# Patient Record
Sex: Female | Born: 1975 | Race: White | Hispanic: No | Marital: Married | Smoking: Former smoker
Health system: Southern US, Community
[De-identification: ages and names within clinical notes are randomized; demographics above are authoritative.]

## PROBLEM LIST (undated history)

## (undated) DIAGNOSIS — K219 Gastro-esophageal reflux disease without esophagitis: Secondary | ICD-10-CM

## (undated) DIAGNOSIS — F419 Anxiety disorder, unspecified: Secondary | ICD-10-CM

## (undated) DIAGNOSIS — J45909 Unspecified asthma, uncomplicated: Secondary | ICD-10-CM

## (undated) DIAGNOSIS — Z9889 Other specified postprocedural states: Secondary | ICD-10-CM

## (undated) DIAGNOSIS — N939 Abnormal uterine and vaginal bleeding, unspecified: Secondary | ICD-10-CM

## (undated) DIAGNOSIS — D219 Benign neoplasm of connective and other soft tissue, unspecified: Secondary | ICD-10-CM

## (undated) DIAGNOSIS — D649 Anemia, unspecified: Secondary | ICD-10-CM

## (undated) DIAGNOSIS — R519 Headache, unspecified: Secondary | ICD-10-CM

## (undated) DIAGNOSIS — R112 Nausea with vomiting, unspecified: Secondary | ICD-10-CM

## (undated) DIAGNOSIS — R51 Headache: Secondary | ICD-10-CM

## (undated) HISTORY — DX: Anxiety disorder, unspecified: F41.9

## (undated) HISTORY — DX: Abnormal uterine and vaginal bleeding, unspecified: N93.9

## (undated) HISTORY — DX: Benign neoplasm of connective and other soft tissue, unspecified: D21.9

## (undated) HISTORY — DX: Anemia, unspecified: D64.9

---

## 2012-04-24 ENCOUNTER — Ambulatory Visit: Payer: Self-pay | Admitting: Family Medicine

## 2013-12-16 ENCOUNTER — Ambulatory Visit: Payer: Self-pay | Admitting: Physician Assistant

## 2016-09-09 ENCOUNTER — Ambulatory Visit (INDEPENDENT_AMBULATORY_CARE_PROVIDER_SITE_OTHER): Payer: Managed Care, Other (non HMO) | Admitting: Obstetrics & Gynecology

## 2016-09-09 ENCOUNTER — Encounter: Payer: Self-pay | Admitting: Obstetrics & Gynecology

## 2016-09-09 VITALS — BP 102/72 | HR 70 | Resp 16 | Ht 65.5 in | Wt 142.0 lb

## 2016-09-09 DIAGNOSIS — A0472 Enterocolitis due to Clostridium difficile, not specified as recurrent: Secondary | ICD-10-CM | POA: Diagnosis not present

## 2016-09-09 DIAGNOSIS — B001 Herpesviral vesicular dermatitis: Secondary | ICD-10-CM | POA: Insufficient documentation

## 2016-09-09 DIAGNOSIS — J4520 Mild intermittent asthma, uncomplicated: Secondary | ICD-10-CM | POA: Diagnosis not present

## 2016-09-09 DIAGNOSIS — N920 Excessive and frequent menstruation with regular cycle: Secondary | ICD-10-CM

## 2016-09-09 DIAGNOSIS — F411 Generalized anxiety disorder: Secondary | ICD-10-CM | POA: Diagnosis not present

## 2016-09-09 DIAGNOSIS — J45909 Unspecified asthma, uncomplicated: Secondary | ICD-10-CM | POA: Insufficient documentation

## 2016-09-09 LAB — POCT URINE PREGNANCY: PREG TEST UR: NEGATIVE

## 2016-09-09 NOTE — Progress Notes (Addendum)
GYNECOLOGY  VISIT   HPI: 40 y.o. G43P2002 Married Caucasian female here for new patient visit.  She reports she has really heavy cycles that have gradually gotten worse over the past several years.  She reports having two days each month where she can't leave her house.  On these days, she has to change tampons every 30 minutes.  She can pass clots, most recently she passed one the size of her fist.  She's not had any treatment for this except hormonal therapy.  Has been on OCPs and nuva ring.  Has bloating and hormonal side effects  Has been getting gyn at [facility].  Had exam in Jan/Feb.  Reports had normal pap then.  Lives in [place].    GYNECOLOGIC HISTORY: Patient's last menstrual period was 09/05/2016 (exact date). Contraception: None, rarely SA Menopausal hormone therapy: None   Past Medical History:  Diagnosis Date  . Abnormal uterine bleeding   . Anemia   . Anxiety   . Fibroid     Past Surgical History:  Procedure Laterality Date  . CESAREAN SECTION      MEDS:  Reviewed in EPIC and UTD  ALLERGIES: Review of patient's allergies indicates no known allergies.  Family History  Problem Relation Age of Onset  . Melanoma Mother   . Hypertension Mother   . Hyperlipidemia Mother   . Breast cancer Mother   . Heart attack Father   . Neuropathy Father   . Hyperlipidemia Father   . Hypertension Father   . Breast cancer Maternal Grandmother     SH:  Non smoker, married  Review of Systems  All other systems reviewed and are negative.   PHYSICAL EXAMINATION:    BP 102/72 (BP Location: Right Arm, Patient Position: Sitting, Cuff Size: Normal)   Pulse 70   Resp 16   Ht 5' 5.5" (1.664 m)   Wt 142 lb (64.4 kg)   LMP 09/05/2016 (Exact Date)   BMI 23.27 kg/m     General appearance: alert, cooperative and appears stated age No exam performed today Chaperone was present for exam.  Assessment: Menorrhagia H/O prior cesarean section x 2 Mild asthma H/o  anemia  Plan: Plan SHGM with endometrial biopsy.  Pt interested in endometrial ablation.  Mirena IUD use and hysterectomy reviewed as well.  Pt aware needs permanent contraception if proceeds with ablation.  States husband is considering vasectomy.  However, she will consider as well.  As planning PUS, will do pelvic exam then with biopsy as well for surgical planning.    Release of records from PCP will be obtained as well   ~30 minutes spent with patient >50% of time was in face to face discussion of above.  09/19/16.  Outside records obtained.  Pap neg, HR HPV neg, GC/Chl/trich neg.  Obtained 12/18/15.

## 2016-09-17 ENCOUNTER — Other Ambulatory Visit: Payer: Self-pay

## 2016-09-17 DIAGNOSIS — N920 Excessive and frequent menstruation with regular cycle: Secondary | ICD-10-CM

## 2016-09-18 ENCOUNTER — Other Ambulatory Visit: Payer: Managed Care, Other (non HMO)

## 2016-09-18 ENCOUNTER — Telehealth: Payer: Self-pay | Admitting: Obstetrics & Gynecology

## 2016-09-18 ENCOUNTER — Other Ambulatory Visit: Payer: Managed Care, Other (non HMO) | Admitting: Obstetrics & Gynecology

## 2016-09-18 NOTE — Telephone Encounter (Signed)
Patient called and canceled her [facility] with [facility] today. Patient said she is sick and will not be able to come in for her appointment today. Patient is aware she will receive a call to reschedule. 3

## 2016-09-19 ENCOUNTER — Encounter: Payer: Self-pay | Admitting: Obstetrics & Gynecology

## 2016-09-19 NOTE — Telephone Encounter (Signed)
Forwarding to [place] to send letter.  Thanks.

## 2016-09-22 NOTE — Telephone Encounter (Signed)
Patient called to rescheduled appointment for sonohysterogram and endometrial  biopsy. Patient is scheduled on 09/25/16 with Dr Sabra Heck. Patient is aware date, time and cancellation policy. No further questions.  Routing to Dr Sabra Heck

## 2016-09-25 ENCOUNTER — Other Ambulatory Visit: Payer: Managed Care, Other (non HMO)

## 2016-09-25 ENCOUNTER — Other Ambulatory Visit: Payer: Managed Care, Other (non HMO) | Admitting: Obstetrics & Gynecology

## 2016-10-16 ENCOUNTER — Ambulatory Visit (INDEPENDENT_AMBULATORY_CARE_PROVIDER_SITE_OTHER): Payer: Managed Care, Other (non HMO)

## 2016-10-16 ENCOUNTER — Other Ambulatory Visit: Payer: Self-pay | Admitting: Obstetrics & Gynecology

## 2016-10-16 ENCOUNTER — Encounter: Payer: Self-pay | Admitting: Obstetrics & Gynecology

## 2016-10-16 ENCOUNTER — Ambulatory Visit (INDEPENDENT_AMBULATORY_CARE_PROVIDER_SITE_OTHER): Payer: Managed Care, Other (non HMO) | Admitting: Obstetrics & Gynecology

## 2016-10-16 VITALS — BP 116/68 | HR 62 | Resp 14 | Ht 66.0 in | Wt 143.0 lb

## 2016-10-16 DIAGNOSIS — L905 Scar conditions and fibrosis of skin: Secondary | ICD-10-CM | POA: Diagnosis not present

## 2016-10-16 DIAGNOSIS — N920 Excessive and frequent menstruation with regular cycle: Secondary | ICD-10-CM

## 2016-10-16 NOTE — Progress Notes (Signed)
40 y.o. Jamie Lyons here for a pelvic ultrasound with sonohystogram due to menorrhagia and desires for treatment options that do not involve being on HRT.  Pt's cycles have worsened over the past several years.  She has two days where her bleeding is so heavy that she cannot leave her home.  She has been on OCPs and Nuva ring and discontinued these due to bloating and additional hormonal symptoms.  She is most interested in having an endometrial ablation.  Patient's last menstrual period was 10/01/2016.  Contraception:  None, rarely SA  Technique:  Both transabdominal and transvaginal ultrasound examinations of the pelvis were performed. Transabdominal technique was performed for global imaging of the pelvis including uterus, ovaries, adnexal regions, and pelvic cul-de-sac.  It was necessary to proceed with endovaginal exam following the abdominal ultrasound transabdominal exam to visualize the endometrium and adnexa.  Color and duplex Doppler ultrasound was utilized to evaluate blood flow to the ovaries.   FINDINGS: Uterus: 10.4 x 5.8 x 4.2cm Endometrium: 10.25mm Adnexa:  Left: 1.9 x 2.0 x 1.9cm     Right: 3.2 x 2.0 x [id number] with 0000000 follicle Cul de sac: no free fluid  SHSG:  After obtaining appropriate verbal consent from patient, the cervix was visualized using a speculum, and prepped with betadine.  A tenaculum  was applied to the cervix.  Dilation of the cervix was not necessary. The catheter was passed into the uterus and sterile saline introduced, with the following findings:  C section scar 1.10mm from uterine wall.  Findings reviewed with pt.  I do not feel proceeding with endometrial ablation is a good option due to significant risks for bladder injury with ablation.  Options of Mirena IUD vs hysterectomy discussed with pt.  She is concerned about being on hormonal therapy is has to have a hysterectomy.  We reviewed types of hysterectomy and hope for ovarian preservation in a woman  her age.  Increased risks of CVD and bone weakness if ovaries have to be removed.  Recovery and risks generally discussed as well as hospital stay.  If pt decides to proceed with hysterectomy vs IUD, would perform endometrial biopsy as well.  Pt would like to consider options.  Information given.    Assessment:  Menorrhagia that is significantly interfering with her quality of life Very thin uterine wall in area of prior cesarean section.  Do not feel endometrial ablation is safe option considering this finding.   Plan:   Hysterectomy vs Mirena IUD use reviewed.  Pt is going to consider options and make plans in early 2018.  Right now she is leaning towards hysterectomy.  ~30 minutes spent with patient >50% of time was in face to face discussion of above.

## 2016-10-17 ENCOUNTER — Encounter: Payer: Self-pay | Admitting: Obstetrics & Gynecology

## 2016-10-23 DIAGNOSIS — L905 Scar conditions and fibrosis of skin: Secondary | ICD-10-CM | POA: Insufficient documentation

## 2016-10-23 DIAGNOSIS — N920 Excessive and frequent menstruation with regular cycle: Secondary | ICD-10-CM | POA: Insufficient documentation

## 2016-10-27 ENCOUNTER — Telehealth: Payer: Self-pay | Admitting: Obstetrics & Gynecology

## 2016-10-27 NOTE — Telephone Encounter (Signed)
Spoke with patient in regards to surgical benefits. Patient is requesting to schedule in January 2018. Patient understands her benefit plan year will reset on 12/01/16 and we will re-verify at that time. Patient to call back on Friday,  10/31/16 to confirm and proceed with scheduling  Routing to Lamont Snowball  cc: Dr Sabra Heck

## 2016-10-27 NOTE — Telephone Encounter (Signed)
Patient called requesting to schedule surgery. °

## 2016-11-10 NOTE — Telephone Encounter (Signed)
Called patient to follow up regarding recommended surgical procedure. Left message on voicemail requesting a return call.

## 2016-11-11 NOTE — Telephone Encounter (Signed)
Patient is returning a call to [place]. Patient said "I will call Suzy on Friday to schedule my surgery". Patient said you do not need to return her call that she will talk to you on Friday 11/14/16.

## 2016-11-11 NOTE — Telephone Encounter (Signed)
Called patient to follow up regarding recommended surgical procedures. Left message on voicemail requesting a return call

## 2016-11-14 NOTE — Telephone Encounter (Signed)
Return call to patient. Left message to call back. 

## 2016-11-14 NOTE — Telephone Encounter (Signed)
Patient returned call

## 2016-11-14 NOTE — Telephone Encounter (Signed)
Return call from patient at 202pm. Desires to proceed with surgery scheduling. Discussed date options and patient chose 12-22-16. Surgery scheduled for 12-22-16 at 0730 at [facility]. Surgery instruction sheet reviewed and printed copy will be mailed to patient. Questions answered and advised to call back as needed.   Routing to provider for final review. Patient agreeable to disposition. Will close encounter.

## 2016-11-15 ENCOUNTER — Other Ambulatory Visit: Payer: Self-pay | Admitting: Obstetrics & Gynecology

## 2016-11-27 ENCOUNTER — Ambulatory Visit: Payer: Managed Care, Other (non HMO) | Admitting: Obstetrics & Gynecology

## 2016-12-08 ENCOUNTER — Other Ambulatory Visit: Payer: Self-pay | Admitting: Obstetrics & Gynecology

## 2016-12-08 ENCOUNTER — Encounter: Payer: Self-pay | Admitting: Obstetrics & Gynecology

## 2016-12-08 ENCOUNTER — Ambulatory Visit (INDEPENDENT_AMBULATORY_CARE_PROVIDER_SITE_OTHER): Payer: Managed Care, Other (non HMO) | Admitting: Obstetrics & Gynecology

## 2016-12-08 VITALS — BP 108/70 | HR 72 | Resp 16 | Ht 66.0 in | Wt 143.2 lb

## 2016-12-08 DIAGNOSIS — N920 Excessive and frequent menstruation with regular cycle: Secondary | ICD-10-CM

## 2016-12-08 DIAGNOSIS — L905 Scar conditions and fibrosis of skin: Secondary | ICD-10-CM | POA: Diagnosis not present

## 2016-12-08 MED ORDER — OXYCODONE-ACETAMINOPHEN 5-325 MG PO TABS: 2.0000 | ORAL_TABLET | ORAL | 0 refills | Status: DC | PRN

## 2016-12-08 MED ORDER — IBUPROFEN 800 MG PO TABS: 800.0000 mg | ORAL_TABLET | Freq: Three times a day (TID) | ORAL | 0 refills | Status: DC | PRN

## 2016-12-08 NOTE — Addendum Note (Signed)
Addended by: [name] on: 12/08/2016 05:06 PM   Modules accepted: Orders

## 2016-12-08 NOTE — Progress Notes (Unsigned)
41 y.o. [id number] Married{Race/ethnicity:[id number]} female here for discussion of upcoming procedure.  *** planned due to ***.  Pre-op evaluation thus far has included ***.     Ob Hx:   Patient's last menstrual period was 11/26/2016.          Sexually active: {yes no:314532} Birth control: {PLAN CONTRACEPTION:313102} Last pap: Last MMG: Tobacco:  Past Surgical History:  Procedure Laterality Date  . CESAREAN SECTION     2003, 2009    Past Medical History:  Diagnosis Date  . Abnormal uterine bleeding   . Anemia   . Anxiety   . Fibroid     Allergies: Patient has no known allergies.  Current Outpatient Prescriptions  Medication Sig Dispense Refill  . albuterol (PROVENTIL HFA;VENTOLIN HFA) 108 (90 Base) MCG/ACT inhaler Inhale 2 puffs into the lungs daily as needed for wheezing or shortness of breath.     . cetirizine (ZYRTEC) 10 MG tablet Take 10 mg by mouth daily as needed for allergies (alternates between zyrtec and allegra).     . clonazePAM (KLONOPIN) 0.5 MG tablet Take 0.5 mg by mouth daily as needed for anxiety.    . fexofenadine (ALLEGRA) 180 MG tablet Take 180 mg by mouth daily. Alternates between zyrtec and allegra    . ibuprofen (ADVIL,MOTRIN) 200 MG tablet Take 600 mg by mouth every 6 (six) hours as needed for mild pain.    Marland Kitchen ibuprofen (ADVIL,MOTRIN) 800 MG tablet Take 1 tablet (800 mg total) by mouth every 8 (eight) hours as needed. 30 tablet 0  . Omega-3 Fatty Acids (FISH OIL) 500 MG CAPS Take 1,000 mg by mouth daily.     Marland Kitchen oxyCODONE-acetaminophen (PERCOCET) 5-325 MG tablet Take 2 tablets by mouth every 4 (four) hours as needed. use only as much as needed to relieve pain 30 tablet 0  . Probiotic Product (PROBIOTIC PO) Take 1 tablet by mouth daily. Probiotic Restore Ultra    . tetrahydrozoline-zinc (VISINE-AC) 0.05-0.25 % ophthalmic solution Place 2 drops into both eyes 3 (three) times daily as needed (dry eyes).    . valACYclovir (VALTREX) 1000 MG tablet Take 2 tablets by mouth  2 (two) times daily as needed (outbreaks).      No current facility-administered medications for this visit.     ROS: {Ros - complete:30496}  Exam:    LMP 11/26/2016   General appearance: alert and cooperative Head: Normocephalic, without obvious abnormality, atraumatic Neck: no adenopathy, supple, symmetrical, trachea midline and thyroid not enlarged, symmetric, no tenderness/mass/nodules Lungs: clear to auscultation bilaterally Heart: regular rate and rhythm, S1, S2 normal, no murmur, click, rub or gallop Abdomen: soft, non-tender; bowel sounds normal; no masses,  no organomegaly Extremities: extremities normal, atraumatic, no cyanosis or edema Skin: Skin color, texture, turgor normal. No rashes or lesions Lymph nodes: Cervical, supraclavicular, and axillary nodes normal. no inguinal nodes palpated Neurologic: Grossly normal  Pelvic: External genitalia:  no lesions              Urethra: normal appearing urethra with no masses, tenderness or lesions              Bartholins and Skenes: {EXAM; GYN RL:[id number]                 Vagina: {vagina:315903::"normal appearing vagina with normal color and discharge, no lesions"}              Cervix: {exam; gyn cervix:30847}              Pap taken: {yes  NH:[id number]        Bimanual Exam:  Uterus:  {uterus:315905::"uterus is normal size, shape, consistency and nontender"}                                      Adnexa:    {adnexa:311645::"not indicated"}                                      Rectovaginal: Deferred                                      Anus:  {Exam; anus:16940}  A: {CCO Gynecologic Problems:21020265}     P:  *** planned Rx for Motrin and Percocet given. Medications/Vitamins reviewed.  Pt knows needs to stop ***. Hysterectomy brochure given for pre and post op instructions.

## 2016-12-08 NOTE — Progress Notes (Signed)
[name] here to discuss plans for treatment of menorrhagia.  Pt was initially interested in endometrial ablation but with ultrasound performed on 12/22/16, her lower uterine segment was noted to be very thin and concern for bladder thermal injury with ablation was discussed.  She has decided to proceed with hysterectomy.  She is here for discussion of this today.  As well, endometrial boipsy is planned as this has not yet been done.    Procedure discussed with patient.  Hospital stay, recovery and pain management all discussed.  Risks discussed including but not limited to bleeding, 1% risk of receiving a  transfusion, infection, 3-4% risk of bowel/bladder/ureteral/vascular injury discussed as well as possible need for additional surgery if injury does occur discussed.  DVT/PE and rare risk of death discussed.  My actual complications with prior surgeries discussed.  Vaginal cuff dehiscence discussed.  Hernia formation discussed.  Positioning and incision locations discussed.  Patient aware if pathology abnormal she may need additional treatment.  All questions answered.   Ob Hx:   Patient's last menstrual period was 11/26/2016.          Sexually active: No. Birth control: abstinence Last pap: 12/18/15 negative, HR HPV negative  Last MMG: Never Tobacco: Former smoker  Past Surgical History:  Procedure Laterality Date  . CESAREAN SECTION     2003, 2009    Past Medical History:  Diagnosis Date  . Abnormal uterine bleeding   . Anemia   . Anxiety   . Fibroid     Allergies: Patient has no known allergies.  Current Outpatient Prescriptions  Medication Sig Dispense Refill  . albuterol (PROVENTIL HFA;VENTOLIN HFA) 108 (90 Base) MCG/ACT inhaler Inhale 2 puffs into the lungs daily as needed for wheezing or shortness of breath.     . cetirizine (ZYRTEC) 10 MG tablet Take 10 mg by mouth daily as needed for allergies (alternates between zyrtec and allegra).     .  clonazePAM (KLONOPIN) 0.5 MG tablet Take 0.5 mg by mouth daily as needed for anxiety.    . fexofenadine (ALLEGRA) 180 MG tablet Take 180 mg by mouth daily. Alternates between zyrtec and allegra    . ibuprofen (ADVIL,MOTRIN) 200 MG tablet Take 600 mg by mouth every 6 (six) hours as needed for mild pain.    . Omega-3 Fatty Acids (FISH OIL) 500 MG CAPS Take 1,000 mg by mouth daily.     . Probiotic Product (PROBIOTIC PO) Take 1 tablet by mouth daily. Probiotic Restore Ultra    . tetrahydrozoline-zinc (VISINE-AC) 0.05-0.25 % ophthalmic solution Place 2 drops into both eyes 3 (three) times daily as needed (dry eyes).    . valACYclovir (VALTREX) 1000 MG tablet Take 2 tablets by mouth 2 (two) times daily as needed (outbreaks).      No current facility-administered medications for this visit.     ROS: Pertinent items noted in HPI and remainder of comprehensive ROS otherwise negative.  Exam:    BP 108/70 (BP Location: Right Arm, Patient Position: Sitting, Cuff Size: Normal)   Pulse 72   Resp 16   Ht 5\' 6"  (1.676 m)   Wt 143 lb 3.2 oz (65 kg)   LMP 11/26/2016   BMI 23.11 kg/m   General appearance: alert and cooperative Head: Normocephalic, without obvious abnormality, atraumatic Neck: no adenopathy, supple, symmetrical, trachea midline and thyroid not enlarged, symmetric, no tenderness/mass/nodules Lungs: clear to auscultation bilaterally Heart: regular rate and rhythm, S1, S2 normal, no murmur, click, rub  or gallop Abdomen: soft, non-tender; bowel sounds normal; no masses,  no organomegaly Extremities: extremities normal, atraumatic, no cyanosis or edema Skin: Skin color, texture, turgor normal. No rashes or lesions Lymph nodes: Cervical, supraclavicular, and axillary nodes normal. no inguinal nodes palpated Neurologic: Grossly normal  Pelvic: External genitalia:  no lesions              Urethra: normal appearing urethra with no masses, tenderness or lesions              Bartholins and  Skenes: normal                 Vagina: normal appearing vagina with normal color and discharge, no lesions              Cervix: normal appearance              Pap taken: No.        Bimanual Exam:  Uterus:  uterus is normal size, shape, consistency and nontender                                      Adnexa:    normal adnexa in size, nontender and no masses                                      Rectovaginal: Deferred                                      Anus:  normal sphincter tone, no lesions  Endometrial biopsy recommended.  Discussed with patient.  Verbal consent obtained.   Procedure:  Speculum placed.  Cervix visualized and cleansed with betadine prep.  A single toothed tenaculum was applied to the anterior lip of the cervix.  Endometrial pipelle was advanced through the cervix into the endometrial cavity without difficulty.  Pipelle passed to 8.5cm.  Suction applied and pipelle removed with good tissue sample obtained.  Two passes performed.  Tenculum removed.  No bleeding noted.  Patient tolerated procedure well.  A: Menorrhagia Thin lower uterine segment with concerns for thermal bladder injury with ablation  P:  TLH/bilateral salpingectomy/cystoscopy planned Rx for Motrin and Percocet given. Medications/Vitamins reviewed. Hysterectomy brochure given for pre and post op instructions.  ~30 minutes spent with patient >50% of time was in face to face discussion of above.

## 2016-12-10 NOTE — Patient Instructions (Addendum)
Your procedure is scheduled on:  Monday, Jan. 22, 2018  Enter through the [employer] [facility] at:  6:00 AM  Pick up the phone at the desk and dial [phone].  Call this number if you have problems the morning of surgery: [phone].  Remember: Do NOT eat food or drink after:  Midnight Sunday, Jan. 21, 2018  Take these medicines the morning of surgery with a SIP OF WATER:  Allegra, Clonazepam if needed  Bring Asthma Inhaler day of surgery  Stop ALL herbal medications, Ibuprofen, and fish oil at this time  Do NOT smoke the day of surgery.  Do NOT wear jewelry (body piercing), metal hair clips/bobby pins, make-up, or nail polish. Do NOT wear lotions, powders, or perfumes.  You may wear deodorant. Do NOT shave for 48 hours prior to surgery. Do NOT bring valuables to the hospital. Contacts, dentures, or bridgework may not be worn into surgery.  Leave suitcase in car.  After surgery it may be brought to your room.  For patients admitted to the hospital, checkout time is 11:00 AM the day of discharge.  Bring a copy of your healthcare power of attorney and living will documents.

## 2016-12-11 ENCOUNTER — Encounter (HOSPITAL_COMMUNITY): Payer: Self-pay

## 2016-12-11 ENCOUNTER — Encounter (HOSPITAL_COMMUNITY)
Admission: RE | Admit: 2016-12-11 | Discharge: 2016-12-11 | Disposition: A | Discharge status: Home / Self Care | Payer: Managed Care, Other (non HMO) | Source: Ambulatory Visit | Attending: Obstetrics & Gynecology | Admitting: Obstetrics & Gynecology

## 2016-12-11 DIAGNOSIS — Z01818 Encounter for other preprocedural examination: Secondary | ICD-10-CM | POA: Diagnosis present

## 2016-12-11 HISTORY — DX: Unspecified asthma, uncomplicated: J45.909

## 2016-12-11 HISTORY — DX: Headache: R51

## 2016-12-11 HISTORY — DX: Gastro-esophageal reflux disease without esophagitis: K21.9

## 2016-12-11 HISTORY — DX: Headache, unspecified: R51.9

## 2016-12-11 LAB — TYPE AND SCREEN
ABO/RH(D): A POS
Antibody Screen: NEGATIVE

## 2016-12-11 LAB — CBC
HCT: 35.1 % — ABNORMAL LOW (ref 36.0–46.0)
Hemoglobin: 12.5 g/dL (ref 12.0–15.0)
MCH: 33.1 pg (ref 26.0–34.0)
MCHC: 35.6 g/dL (ref 30.0–36.0)
MCV: 92.9 fL (ref 78.0–100.0)
PLATELETS: 211 10*3/uL (ref 150–400)
RBC: 3.78 MIL/uL — ABNORMAL LOW (ref 3.87–5.11)
RDW: 12.8 % (ref 11.5–15.5)
WBC: 8.5 10*3/uL (ref 4.0–10.5)

## 2016-12-11 LAB — ABO/RH: ABO/RH(D): A POS

## 2016-12-12 ENCOUNTER — Telehealth: Payer: Self-pay | Admitting: Obstetrics & Gynecology

## 2016-12-12 NOTE — Telephone Encounter (Signed)
Spoke with patient. Advised results from endometrial biopsy have not returned at this time which is not uncommon. Advised this can take around a week to return and she will be contacted as soon as the results have returned. Patient is agreeable.  Routing to provider for final review. Patient agreeable to disposition. Will close encounter.

## 2016-12-12 NOTE — Telephone Encounter (Signed)
Patient is asking if Dr.Miller has received the results from her cervical biopsy done on Monday.

## 2016-12-21 ENCOUNTER — Encounter (HOSPITAL_COMMUNITY): Payer: Self-pay | Admitting: Anesthesiology

## 2016-12-21 MED ORDER — DEXTROSE 5 % IV SOLN
2.0000 g | INTRAVENOUS | Status: AC
  Administered 2016-12-22: 2 g via INTRAVENOUS
  Filled 2016-12-21: qty 2

## 2016-12-21 NOTE — Anesthesia Preprocedure Evaluation (Addendum)
Anesthesia Evaluation  Patient identified by MRN, date of birth, ID band Patient awake    Reviewed: Allergy & Precautions, NPO status , reviewed documented beta blocker date and time   History of Anesthesia Complications (+) PONV and history of anesthetic complications  Airway Mallampati: II  TM Distance: >3 FB Neck ROM: Full    Dental no notable dental hx. (+) Teeth Intact   Pulmonary asthma , former smoker,    Pulmonary exam normal breath sounds clear to auscultation       Cardiovascular Exercise Tolerance: Good negative cardio ROS Normal cardiovascular exam Rhythm:Regular Rate:Normal     Neuro/Psych  Headaches, Anxiety    GI/Hepatic Neg liver ROS, GERD  Medicated and Controlled,  Endo/Other  negative endocrine ROS  Renal/GU negative Renal ROS  negative genitourinary   Musculoskeletal negative musculoskeletal ROS (+)   Abdominal   Peds  Hematology  (+) anemia ,   Anesthesia Other Findings   Reproductive/Obstetrics Menorrhagia Uterine fibroids HSV                             Anesthesia Physical Anesthesia Plan  ASA: II  Anesthesia Plan: General   Post-op Pain Management:    Induction: Intravenous  Airway Management Planned: Oral ETT  Additional Equipment:   Intra-op Plan:   Post-operative Plan: Extubation in OR  Informed Consent:   Plan Discussed with:   Anesthesia Plan Comments:         Anesthesia Quick Evaluation

## 2016-12-22 ENCOUNTER — Ambulatory Visit (HOSPITAL_COMMUNITY)
Admission: RE | Admit: 2016-12-22 | Discharge: 2016-12-23 | Disposition: A | Discharge status: Home / Self Care | Payer: Managed Care, Other (non HMO) | Source: Ambulatory Visit | Attending: Obstetrics & Gynecology | Admitting: Obstetrics & Gynecology

## 2016-12-22 ENCOUNTER — Encounter (HOSPITAL_COMMUNITY)
Admission: RE | Disposition: A | Discharge status: Home / Self Care | Payer: Self-pay | Source: Ambulatory Visit | Attending: Obstetrics & Gynecology

## 2016-12-22 ENCOUNTER — Encounter (HOSPITAL_COMMUNITY): Payer: Self-pay | Admitting: *Deleted

## 2016-12-22 ENCOUNTER — Ambulatory Visit (HOSPITAL_COMMUNITY): Payer: Managed Care, Other (non HMO) | Admitting: Anesthesiology

## 2016-12-22 DIAGNOSIS — F419 Anxiety disorder, unspecified: Secondary | ICD-10-CM | POA: Insufficient documentation

## 2016-12-22 DIAGNOSIS — J45909 Unspecified asthma, uncomplicated: Secondary | ICD-10-CM | POA: Insufficient documentation

## 2016-12-22 DIAGNOSIS — Z79899 Other long term (current) drug therapy: Secondary | ICD-10-CM | POA: Insufficient documentation

## 2016-12-22 DIAGNOSIS — N736 Female pelvic peritoneal adhesions (postinfective): Secondary | ICD-10-CM | POA: Insufficient documentation

## 2016-12-22 DIAGNOSIS — N3289 Other specified disorders of bladder: Secondary | ICD-10-CM | POA: Diagnosis not present

## 2016-12-22 DIAGNOSIS — Z87891 Personal history of nicotine dependence: Secondary | ICD-10-CM | POA: Insufficient documentation

## 2016-12-22 DIAGNOSIS — N888 Other specified noninflammatory disorders of cervix uteri: Secondary | ICD-10-CM | POA: Insufficient documentation

## 2016-12-22 DIAGNOSIS — N920 Excessive and frequent menstruation with regular cycle: Secondary | ICD-10-CM | POA: Diagnosis not present

## 2016-12-22 HISTORY — PX: LAPAROSCOPIC HYSTERECTOMY: SHX1926

## 2016-12-22 HISTORY — DX: Nausea with vomiting, unspecified: Z98.890

## 2016-12-22 HISTORY — DX: Nausea with vomiting, unspecified: R11.2

## 2016-12-22 HISTORY — PX: LAPAROSCOPIC BILATERAL SALPINGECTOMY: SHX5889

## 2016-12-22 HISTORY — PX: CYSTOSCOPY: SHX5120

## 2016-12-22 LAB — PREGNANCY, URINE: Preg Test, Ur: NEGATIVE

## 2016-12-22 SURGERY — HYSTERECTOMY, TOTAL, LAPAROSCOPIC
Anesthesia: General | Site: Bladder

## 2016-12-22 MED ORDER — SCOPOLAMINE 1 MG/3DAYS TD PT72
MEDICATED_PATCH | TRANSDERMAL | Status: AC
  Administered 2016-12-22: 1.5 mg via TRANSDERMAL
  Filled 2016-12-22: qty 1

## 2016-12-22 MED ORDER — BUPIVACAINE HCL (PF) 0.25 % IJ SOLN
INTRAMUSCULAR | Status: AC
  Filled 2016-12-22: qty 60

## 2016-12-22 MED ORDER — FENTANYL CITRATE (PF) 100 MCG/2ML IJ SOLN
INTRAMUSCULAR | Status: DC | PRN
  Administered 2016-12-22: 100 ug via INTRAVENOUS
  Administered 2016-12-22 (×2): 50 ug via INTRAVENOUS
  Administered 2016-12-22 (×3): 100 ug via INTRAVENOUS

## 2016-12-22 MED ORDER — LIDOCAINE HCL (CARDIAC) 20 MG/ML IV SOLN
INTRAVENOUS | Status: AC
  Filled 2016-12-22: qty 5

## 2016-12-22 MED ORDER — DEXAMETHASONE SODIUM PHOSPHATE 10 MG/ML IJ SOLN
INTRAMUSCULAR | Status: AC
  Filled 2016-12-22: qty 1

## 2016-12-22 MED ORDER — SODIUM CHLORIDE 0.9 % IR SOLN
Status: DC | PRN
  Administered 2016-12-22: 3000 mL

## 2016-12-22 MED ORDER — SODIUM CHLORIDE 0.9% FLUSH: 9.0000 mL | INTRAVENOUS | Status: DC | PRN

## 2016-12-22 MED ORDER — METOCLOPRAMIDE HCL 5 MG/ML IJ SOLN
10.0000 mg | Freq: Once | INTRAMUSCULAR | Status: AC | PRN
  Administered 2016-12-22: 10 mg via INTRAVENOUS

## 2016-12-22 MED ORDER — ONDANSETRON HCL 4 MG/2ML IJ SOLN
4.0000 mg | Freq: Four times a day (QID) | INTRAMUSCULAR | Status: DC | PRN
  Administered 2016-12-22: 4 mg via INTRAVENOUS
  Filled 2016-12-22: qty 2

## 2016-12-22 MED ORDER — ONDANSETRON HCL 4 MG/2ML IJ SOLN
INTRAMUSCULAR | Status: DC | PRN
  Administered 2016-12-22: 4 mg via INTRAVENOUS

## 2016-12-22 MED ORDER — ACETAMINOPHEN 10 MG/ML IV SOLN
1000.0000 mg | Freq: Once | INTRAVENOUS | Status: AC
  Administered 2016-12-22: 1000 mg via INTRAVENOUS
  Filled 2016-12-22: qty 100

## 2016-12-22 MED ORDER — HYDROMORPHONE HCL 1 MG/ML IJ SOLN
INTRAMUSCULAR | Status: DC | PRN
  Administered 2016-12-22: 1 mg via INTRAVENOUS

## 2016-12-22 MED ORDER — PROPOFOL 10 MG/ML IV BOLUS
INTRAVENOUS | Status: DC | PRN
  Administered 2016-12-22: 50 mg via INTRAVENOUS
  Administered 2016-12-22: 150 mg via INTRAVENOUS

## 2016-12-22 MED ORDER — KETOROLAC TROMETHAMINE 30 MG/ML IJ SOLN
30.0000 mg | Freq: Four times a day (QID) | INTRAMUSCULAR | Status: DC
  Administered 2016-12-22 – 2016-12-23 (×4): 30 mg via INTRAVENOUS
  Filled 2016-12-22 (×4): qty 1

## 2016-12-22 MED ORDER — OXYCODONE-ACETAMINOPHEN 5-325 MG PO TABS: 1.0000 | ORAL_TABLET | ORAL | Status: DC | PRN

## 2016-12-22 MED ORDER — SIMETHICONE 80 MG PO CHEW
80.0000 mg | CHEWABLE_TABLET | Freq: Four times a day (QID) | ORAL | Status: DC | PRN
Start: 2016-12-22 — End: 2016-12-23
  Administered 2016-12-23: 80 mg via ORAL
  Filled 2016-12-22: qty 1

## 2016-12-22 MED ORDER — HYDROMORPHONE 1 MG/ML IV SOLN
INTRAVENOUS | Status: DC
  Administered 2016-12-22: 2.6 mg via INTRAVENOUS
  Administered 2016-12-22: 14:00:00 via INTRAVENOUS
  Administered 2016-12-22: 3.2 mg via INTRAVENOUS
  Administered 2016-12-23: 0.8 mg via INTRAVENOUS
  Administered 2016-12-23: 0.4 mg via INTRAVENOUS
  Administered 2016-12-23: 3 mg via INTRAVENOUS
  Filled 2016-12-22: qty 25

## 2016-12-22 MED ORDER — LACTATED RINGERS IV SOLN
INTRAVENOUS | Status: DC
  Administered 2016-12-22 (×3): via INTRAVENOUS

## 2016-12-22 MED ORDER — FENTANYL CITRATE (PF) 250 MCG/5ML IJ SOLN
INTRAMUSCULAR | Status: AC
  Filled 2016-12-22: qty 5

## 2016-12-22 MED ORDER — SUGAMMADEX SODIUM 500 MG/5ML IV SOLN
INTRAVENOUS | Status: AC
  Filled 2016-12-22: qty 5

## 2016-12-22 MED ORDER — PANTOPRAZOLE SODIUM 40 MG IV SOLR
40.0000 mg | Freq: Every day | INTRAVENOUS | Status: DC
  Administered 2016-12-22: 40 mg via INTRAVENOUS
  Filled 2016-12-22 (×2): qty 40

## 2016-12-22 MED ORDER — KETOROLAC TROMETHAMINE 30 MG/ML IJ SOLN
INTRAMUSCULAR | Status: AC
  Filled 2016-12-22: qty 1

## 2016-12-22 MED ORDER — DIPHENHYDRAMINE HCL 12.5 MG/5ML PO ELIX: 12.5000 mg | ORAL_SOLUTION | Freq: Four times a day (QID) | ORAL | Status: DC | PRN

## 2016-12-22 MED ORDER — PROPOFOL 10 MG/ML IV BOLUS
INTRAVENOUS | Status: AC
  Filled 2016-12-22: qty 20

## 2016-12-22 MED ORDER — HYDROCORTISONE 2.5 % RE CREA
TOPICAL_CREAM | Freq: Four times a day (QID) | RECTAL | Status: DC
  Administered 2016-12-23: 14:00:00 via RECTAL
  Filled 2016-12-22: qty 28.35

## 2016-12-22 MED ORDER — SUGAMMADEX SODIUM 200 MG/2ML IV SOLN
INTRAVENOUS | Status: DC | PRN
  Administered 2016-12-22: 200 mg via INTRAVENOUS

## 2016-12-22 MED ORDER — LIDOCAINE 5 % EX OINT
TOPICAL_OINTMENT | Freq: Four times a day (QID) | CUTANEOUS | Status: DC | PRN
  Administered 2016-12-23: 14:00:00 via TOPICAL
  Filled 2016-12-22: qty 35.44

## 2016-12-22 MED ORDER — MIDAZOLAM HCL 2 MG/2ML IJ SOLN
INTRAMUSCULAR | Status: AC
  Filled 2016-12-22: qty 2

## 2016-12-22 MED ORDER — BUPIVACAINE HCL (PF) 0.25 % IJ SOLN
INTRAMUSCULAR | Status: DC | PRN
  Administered 2016-12-22: 16 mL

## 2016-12-22 MED ORDER — KETOROLAC TROMETHAMINE 30 MG/ML IJ SOLN: 30.0000 mg | Freq: Four times a day (QID) | INTRAMUSCULAR | Status: DC

## 2016-12-22 MED ORDER — HYDROMORPHONE HCL 1 MG/ML IJ SOLN: 0.2500 mg | INTRAMUSCULAR | Status: DC | PRN

## 2016-12-22 MED ORDER — SUGAMMADEX SODIUM 200 MG/2ML IV SOLN
INTRAVENOUS | Status: AC
  Filled 2016-12-22: qty 2

## 2016-12-22 MED ORDER — DEXTROSE-NACL 5-0.45 % IV SOLN
INTRAVENOUS | Status: DC
  Administered 2016-12-22 – 2016-12-23 (×3): via INTRAVENOUS

## 2016-12-22 MED ORDER — MIDAZOLAM HCL 2 MG/2ML IJ SOLN
INTRAMUSCULAR | Status: DC | PRN
  Administered 2016-12-22: 2 mg via INTRAVENOUS

## 2016-12-22 MED ORDER — KETOROLAC TROMETHAMINE 30 MG/ML IJ SOLN
INTRAMUSCULAR | Status: DC | PRN
  Administered 2016-12-22: 30 mg via INTRAVENOUS

## 2016-12-22 MED ORDER — SCOPOLAMINE 1 MG/3DAYS TD PT72
1.0000 | MEDICATED_PATCH | Freq: Once | TRANSDERMAL | Status: DC
  Administered 2016-12-22: 1.5 mg via TRANSDERMAL

## 2016-12-22 MED ORDER — METOCLOPRAMIDE HCL 5 MG/ML IJ SOLN
INTRAMUSCULAR | Status: AC
  Administered 2016-12-22: 10 mg via INTRAVENOUS
  Filled 2016-12-22: qty 2

## 2016-12-22 MED ORDER — NALOXONE HCL 0.4 MG/ML IJ SOLN: 0.4000 mg | INTRAMUSCULAR | Status: DC | PRN

## 2016-12-22 MED ORDER — ALUM & MAG HYDROXIDE-SIMETH 200-200-20 MG/5ML PO SUSP: 30.0000 mL | ORAL | Status: DC | PRN

## 2016-12-22 MED ORDER — SODIUM CHLORIDE 0.9 % IV SOLN
INTRAVENOUS | Status: DC | PRN
  Administered 2016-12-22: 60 mL

## 2016-12-22 MED ORDER — DIPHENHYDRAMINE HCL 50 MG/ML IJ SOLN: 12.5000 mg | Freq: Four times a day (QID) | INTRAMUSCULAR | Status: DC | PRN

## 2016-12-22 MED ORDER — ACETAMINOPHEN 325 MG PO TABS: 650.0000 mg | ORAL_TABLET | ORAL | Status: DC | PRN

## 2016-12-22 MED ORDER — FENTANYL CITRATE (PF) 100 MCG/2ML IJ SOLN
INTRAMUSCULAR | Status: AC
  Filled 2016-12-22: qty 2

## 2016-12-22 MED ORDER — ROCURONIUM BROMIDE 100 MG/10ML IV SOLN
INTRAVENOUS | Status: AC
  Filled 2016-12-22: qty 1

## 2016-12-22 MED ORDER — HYDROMORPHONE HCL 1 MG/ML IJ SOLN
INTRAMUSCULAR | Status: AC
  Filled 2016-12-22: qty 1

## 2016-12-22 MED ORDER — MENTHOL 3 MG MT LOZG: 1.0000 | LOZENGE | OROMUCOSAL | Status: DC | PRN

## 2016-12-22 MED ORDER — MEPERIDINE HCL 25 MG/ML IJ SOLN: 6.2500 mg | INTRAMUSCULAR | Status: DC | PRN

## 2016-12-22 MED ORDER — ROCURONIUM BROMIDE 100 MG/10ML IV SOLN
INTRAVENOUS | Status: DC | PRN
  Administered 2016-12-22 (×3): 10 mg via INTRAVENOUS
  Administered 2016-12-22: 20 mg via INTRAVENOUS
  Administered 2016-12-22 (×3): 10 mg via INTRAVENOUS
  Administered 2016-12-22: 50 mg via INTRAVENOUS
  Administered 2016-12-22: 10 mg via INTRAVENOUS

## 2016-12-22 MED ORDER — STERILE WATER FOR IRRIGATION IR SOLN
Status: DC | PRN
  Administered 2016-12-22: 1000 mL

## 2016-12-22 MED ORDER — DEXAMETHASONE SODIUM PHOSPHATE 10 MG/ML IJ SOLN
INTRAMUSCULAR | Status: DC | PRN
  Administered 2016-12-22: 10 mg via INTRAVENOUS

## 2016-12-22 MED ORDER — PROMETHAZINE HCL 25 MG/ML IJ SOLN
12.5000 mg | INTRAMUSCULAR | Status: DC | PRN
Start: 2016-12-22 — End: 2016-12-23
  Administered 2016-12-22 – 2016-12-23 (×2): 25 mg via INTRAVENOUS
  Filled 2016-12-22 (×2): qty 1

## 2016-12-22 MED ORDER — ALBUTEROL SULFATE (2.5 MG/3ML) 0.083% IN NEBU: 3.0000 mL | INHALATION_SOLUTION | Freq: Every day | RESPIRATORY_TRACT | Status: DC | PRN

## 2016-12-22 MED ORDER — ROPIVACAINE HCL 5 MG/ML IJ SOLN
INTRAMUSCULAR | Status: AC
  Filled 2016-12-22: qty 30

## 2016-12-22 MED ORDER — ONDANSETRON HCL 4 MG/2ML IJ SOLN
INTRAMUSCULAR | Status: AC
  Filled 2016-12-22: qty 2

## 2016-12-22 SURGICAL SUPPLY — 73 items
APPLICATOR ARISTA FLEXITIP XL (MISCELLANEOUS) ×5 IMPLANT
BAG URINE DRAINAGE (UROLOGICAL SUPPLIES) ×5 IMPLANT
BENZOIN TINCTURE PRP APPL 2/3 (GAUZE/BANDAGES/DRESSINGS) IMPLANT
CABLE HIGH FREQUENCY MONO STRZ (ELECTRODE) ×5 IMPLANT
CATH FOLEY 3WAY 30CC 16FR (CATHETERS) ×5 IMPLANT
CATH ROBINSON RED A/P 16FR (CATHETERS) IMPLANT
CATH SILICONE 16FRX5CC (CATHETERS) IMPLANT
CLOSURE WOUND 1/2 X4 (GAUZE/BANDAGES/DRESSINGS)
CLOTH BEACON ORANGE TIMEOUT ST (SAFETY) ×5 IMPLANT
COVER LIGHT HANDLE  1/PK (MISCELLANEOUS) ×2
COVER LIGHT HANDLE 1/PK (MISCELLANEOUS) ×3 IMPLANT
COVER MAYO STAND STRL (DRAPES) ×5 IMPLANT
DERMABOND ADVANCED (GAUZE/BANDAGES/DRESSINGS) ×2
DERMABOND ADVANCED .7 DNX12 (GAUZE/BANDAGES/DRESSINGS) ×3 IMPLANT
DRSG OPSITE POSTOP 3X4 (GAUZE/BANDAGES/DRESSINGS) ×5 IMPLANT
DURAPREP 26ML APPLICATOR (WOUND CARE) ×5 IMPLANT
GLOVE BIOGEL PI IND STRL 7.0 (GLOVE) ×21 IMPLANT
GLOVE BIOGEL PI INDICATOR 7.0 (GLOVE) ×14
GLOVE ECLIPSE 6.5 STRL STRAW (GLOVE) ×25 IMPLANT
GOWN STRL REUS W/TWL LRG LVL3 (GOWN DISPOSABLE) ×35 IMPLANT
HEMOSTAT ARISTA ABSORB 3G PWDR (MISCELLANEOUS) ×5 IMPLANT
LIGASURE VESSEL 5MM BLUNT TIP (ELECTROSURGICAL) ×4 IMPLANT
MANIPULATOR ADVINCU DEL 2.5 SS (MISCELLANEOUS) ×4 IMPLANT
NEEDLE INSUFFLATION 120MM (ENDOMECHANICALS) ×5 IMPLANT
NS IRRIG 1000ML POUR BTL (IV SOLUTION) ×5 IMPLANT
OCCLUDER COLPOPNEUMO (BALLOONS) ×5 IMPLANT
PACK LAPAROSCOPY BASIN (CUSTOM PROCEDURE TRAY) ×5 IMPLANT
PACK TRENDGUARD 450 HYBRID PRO (MISCELLANEOUS) ×3 IMPLANT
PACK TRENDGUARD 600 HYBRD PROC (MISCELLANEOUS) IMPLANT
POUCH LAPAROSCOPIC INSTRUMENT (MISCELLANEOUS) ×5 IMPLANT
POUCH SPECIMEN RETRIEVAL 10MM (ENDOMECHANICALS) IMPLANT
PROTECTOR NERVE ULNAR (MISCELLANEOUS) ×10 IMPLANT
SCISSORS LAP 5X35 DISP (ENDOMECHANICALS) ×5 IMPLANT
SEALER TISSUE G2 CVD JAW 35 (ENDOMECHANICALS) IMPLANT
SEALER TISSUE G2 CVD JAW 45CM (ENDOMECHANICALS)
SET CYSTO W/LG BORE CLAMP LF (SET/KITS/TRAYS/PACK) ×10 IMPLANT
SET IRRIG TUBING LAPAROSCOPIC (IRRIGATION / IRRIGATOR) ×5 IMPLANT
SET TRI-LUMEN FLTR TB AIRSEAL (TUBING) ×5 IMPLANT
SHEARS HARMONIC ACE PLUS 36CM (ENDOMECHANICALS) ×5 IMPLANT
SLEEVE ADV FIXATION 5X100MM (TROCAR) ×10 IMPLANT
SLEEVE XCEL OPT CAN 5 100 (ENDOMECHANICALS) ×4 IMPLANT
SOLUTION ELECTROLUBE (MISCELLANEOUS) IMPLANT
SPONGE GAUZE 4X4 12PLY STER LF (GAUZE/BANDAGES/DRESSINGS) ×5 IMPLANT
STRIP CLOSURE SKIN 1/2X4 (GAUZE/BANDAGES/DRESSINGS) IMPLANT
SUT VIC AB 0 CT1 27 (SUTURE) ×4
SUT VIC AB 0 CT1 27XBRD ANBCTR (SUTURE) ×6 IMPLANT
SUT VIC AB 0 CT1 36 (SUTURE) ×10 IMPLANT
SUT VICRYL 0 UR6 27IN ABS (SUTURE) ×5 IMPLANT
SUT VICRYL 4-0 PS2 18IN ABS (SUTURE) ×5 IMPLANT
SUT VLOC 180 0 9IN  GS21 (SUTURE) ×4
SUT VLOC 180 0 9IN GS21 (SUTURE) ×6 IMPLANT
SYR 30ML LL (SYRINGE) IMPLANT
SYR 50ML LL SCALE MARK (SYRINGE) ×15 IMPLANT
SYRINGE 10CC LL (SYRINGE) ×5 IMPLANT
SYSTEM CARTER THOMASON II (TROCAR) IMPLANT
TIP UTERINE 5.1X6CM LAV DISP (MISCELLANEOUS) IMPLANT
TIP UTERINE 6.7X10CM GRN DISP (MISCELLANEOUS) IMPLANT
TIP UTERINE 6.7X6CM WHT DISP (MISCELLANEOUS) IMPLANT
TIP UTERINE 6.7X8CM BLUE DISP (MISCELLANEOUS) IMPLANT
TOWEL OR 17X24 6PK STRL BLUE (TOWEL DISPOSABLE) ×10 IMPLANT
TRAY FOLEY CATH SILVER 14FR (SET/KITS/TRAYS/PACK) ×5 IMPLANT
TRENDGUARD 450 HYBRID PRO PACK (MISCELLANEOUS) ×5
TRENDGUARD 600 HYBRID PROC PK (MISCELLANEOUS)
TROCAR ADV FIXATION 5X100MM (TROCAR) ×5 IMPLANT
TROCAR BALLN 12MMX100 BLUNT (TROCAR) IMPLANT
TROCAR PORT AIRSEAL 5X120 (TROCAR) IMPLANT
TROCAR PORT AIRSEAL 8X120 (TROCAR) ×5 IMPLANT
TROCAR XCEL NON BLADE 8MM B8LT (ENDOMECHANICALS) ×5 IMPLANT
TROCAR XCEL NON-BLD 11X100MML (ENDOMECHANICALS) IMPLANT
TROCAR XCEL NON-BLD 5MMX100MML (ENDOMECHANICALS) ×5 IMPLANT
TROCAR XCEL OPT SLVE 5M 100M (ENDOMECHANICALS) ×5 IMPLANT
WARMER LAPAROSCOPE (MISCELLANEOUS) ×5 IMPLANT
WATER STERILE IRR 1000ML POUR (IV SOLUTION) ×5 IMPLANT

## 2016-12-22 NOTE — Anesthesia Postprocedure Evaluation (Signed)
Anesthesia Post Note  Patient: JONIAH NAMETH  Procedure(s) Performed: Procedure(s) (LRB): HYSTERECTOMY TOTAL LAPAROSCOPIC (N/A) LAPAROSCOPIC BILATERAL SALPINGECTOMY (Bilateral) CYSTOSCOPY (N/A)  Patient location during evaluation: PACU Anesthesia Type: General Level of consciousness: awake and alert Pain management: pain level controlled Vital Signs Assessment: post-procedure vital signs reviewed and stable Respiratory status: spontaneous breathing, nonlabored ventilation, respiratory function stable and patient connected to nasal cannula oxygen Cardiovascular status: blood pressure returned to baseline and stable Postop Assessment: no signs of nausea or vomiting Anesthetic complications: no        Last Vitals:  Vitals:   12/22/16 1422 12/22/16 1500  BP:  112/64  Pulse:  (!) 58  Resp: 17 12  Temp:  36.9 C    Last Pain:  Vitals:   12/22/16 1500  TempSrc: Oral  PainSc: 3    Pain Goal: Patients Stated Pain Goal: 3 (12/22/16 1500)               Ciera Beckum

## 2016-12-22 NOTE — Anesthesia Postprocedure Evaluation (Signed)
Anesthesia Post Note  Patient: Jamie Lyons  Procedure(s) Performed: Procedure(s) (LRB): HYSTERECTOMY TOTAL LAPAROSCOPIC (N/A) LAPAROSCOPIC BILATERAL SALPINGECTOMY (Bilateral) CYSTOSCOPY (N/A)  Patient location during evaluation: PACU Anesthesia Type: General Level of consciousness: awake and alert and oriented Pain management: pain level controlled Vital Signs Assessment: post-procedure vital signs reviewed and stable Respiratory status: spontaneous breathing, nonlabored ventilation, respiratory function stable and patient connected to nasal cannula oxygen Cardiovascular status: blood pressure returned to baseline and stable Postop Assessment: no signs of nausea or vomiting Anesthetic complications: no        Last Vitals:  Vitals:   12/22/16 0609 12/22/16 1200  BP: 109/65   Pulse: 73   Resp: 18   Temp: 37.1 C 37.1 C    Last Pain:  Vitals:   12/22/16 1200  TempSrc:   PainSc: 2    Pain Goal: Patients Stated Pain Goal: 3 (12/22/16 0609)               Ibrahim Mcpheeters A.

## 2016-12-22 NOTE — OR Nursing (Signed)
Called husband with an  update @ 0945 per Dr. Sabra Heck

## 2016-12-22 NOTE — Op Note (Addendum)
12/22/2016  12:31 PM  PATIENT:  Jamie Lyons  41 y.o. female  PRE-OPERATIVE DIAGNOSIS:  Menorrhagia, urinary urgency, thin lower uterine segment noted on ultrasound as pt initially desired an endometrial ablation, c section x 2  POST-OPERATIVE DIAGNOSIS:  MENORRHAGIA, significant adhesions from prior cesarean section with uterus completely adhered to the anterior abdominal wall (all the way up to the fundus), significant bladder adhesions from prior cesarean section  PROCEDURE:  Procedure(s): HYSTERECTOMY TOTAL LAPAROSCOPIC LAPAROSCOPIC BILATERAL SALPINGECTOMY CYSTOSCOPY EXTENSIVE LOA   SURGEON:  Maclane Holloran SUZANNE  ASSISTANTS: JERTSON, JILL   ANESTHESIA:   general  ESTIMATED BLOOD LOSS: 200cc  BLOOD ADMINISTERED:none   FLUIDS: 2300cc LR  UOP: 400cc urine  SPECIMEN:  Uterus, cervix, bilateral fallopian tubes  DISPOSITION OF SPECIMEN:  PATHOLOGY  FINDINGS: significant adhesions of the uterus to the anterior abdominal wall all the way from the cervix to the fundus of the uterus, significant adhesions of the bladder, left fallopian tube adhesions to the fundus of the uterus and the anterior abdominal wall, adhesions of the omentum to the fundus of the uterus and the anterior abdominal wall  DESCRIPTION OF OPERATION: Patient is taken to the operating room. She is placed in the supine position. She is a running IV in place. Informed consent was present on the chart. SCDs on her lower extremities and functioning properly. General endotracheal anesthesia was administered by the anesthesia staff without difficulty. Dr. Royce Macadamia oversaw case. Once adequate anesthesia was confirmed the legs are placed in the low lithotomy position in [name]. Her arms were tucked by the side.   [name] prep was then used to prep the abdomen and Betadine was used to prep the inner thighs, perineum and vagina. Once 3 minutes had past the patient was draped in a normal standard fashion. The legs were  lifted to the high lithotomy position. The cervix was visualized by placing a heavy weighted speculum in the posterior aspect of the vagina and using a curved Deaver retractor to the retract anteriorly. The anterior lip of the cervix was grasped with single-tooth tenaculum.  The uterus sounded to 10 cm. Pratt dilators were used to dilate the cervix up to a #23. A RUMI uterine manipulator was obtained. A #10 disposable tip was placed on the RUMI manipulator as well as a small, silver KOH ring. This was passed through the cervix and the bulb of the disposable tip was inflated with 10 cc of normal saline. There was a good fit of the KOH ring around the cervix. The tenaculum was removed. There is also good manipulation of the uterus. The speculum and retractor were removed as well. A Foley catheter was placed to straight drain.  A three way foley was used as I suspected there were some adhesions due to the positioning of the uterus. Clear urine was noted. Legs were lowered to the low lithotomy position and attention was turned the abdomen.  The umbilicus was everted.  A Veress needle was obtained. Syringe of sterile saline was placed on a open Veress needle.  This was passed into the umbilicus until just when the fluid started to drip.  Then low flow CO2 gas was attached the needle and the pneumoperitoneum was achieved without difficulty. Once four liters of gas was in the abdomen the Veress needle was removed and a 5 millimeter non-bladed Optiview trocar and port were passed directly to the abdomen. The laparoscope was then used to confirm intraperitoneal placement. A normal sized uterus was noted but significant  adhesions were noted as above.    Ovaries were normal.  Locations for RLQ and LLQ ports were noted by transillumination of the abdominal wall.  0.25% marcaine was used to anesthetize the skin.  66mm skin incision was made in the LLQ and a non bladed 53mm trochar and port was placed.  Then an 50mm incision was  made in the RLQ and an 1mm Air Seal port was placed in the RLQ.  Due to the adhesions the midline port could not be placed at this time.    Ureters were identifies.  Attention was turned anteriorly.  The omentum was resected from the fundus of the uterus using the ligasure device.  Then using careful dissection, the uterus was dissected all the anterior abdominal wall using both sharp dissection with counter traction and the Harmonic scalp when appropriate.  The left fallopian tube was excised off the anterior abdominal wall and then freed from the ovary using the Ligasure apparatus.  Then the mesosalpinx was serially clamped, cauterized and incised.  Then the fallopian tube was incised at the fundus of the uterus.  This was brought out as a specimen.  The bladder was adhered to the uterus so the bladder was inflated with 400 cc of normal saline to better visualized it.  The foley was released every 30-45 minutes to make sure the bladder did not overfill.    Once the right side of the uterus has more normal anatomy, the right tube was excised off the ovary and mesosalpinx was dissected to free the tube. Then the right utero-ovarian pedicle was serially clamped cauterized and incised using the ligasure device. Right round ligament was serially clamped cauterized and incised. The posterior peritoneum of the inferior leaf of the broad ligament were opened.  Then the dissection of the bladder off the uterus was continued.  This was done slowly and carefully.  The dissection was low enough now that the fourth port could be placed.  About 4 cm above the pubic symphysis, the skin was incised about 1cm and a non-bladed 8 port was placed with direct visualization of the laparoscope  At this point, the left uterine ovarian pedicle was serially clamped cauterized and incised and the left round ligament was serially clamped cauterized and incised.   Continued dissection of the bladder was performed until the correct  plane between the bladder and cervix was noted.  At this point, the bladder could be completed dissected free from the uterus.  The bladder was well below the level of the KOH ring on the left size. The foley was then placed to straight drain and the urine and saline drained.  Clear urine was noted.  The left uterine artery skeletonized. Then the left uterine artery, above the level of the KOH ring, was serially clamped cauterized and incised. Next the right uterine artery was skeletonized and the right uterine artery was clamped, cauterized and incised.  The uterus was devascularized at this point.    The colpotomy was performed a starting in the midline and using a harmonic scalpel with the inferior edge of the open blade  This was carried around a circumferential fashion until the vaginal mucosa was completely incised in the specimen was freed.  The specimen was then delivered into the vagina and used to occlude the vagina to maintain the pneumoperitoneum  Instruments were changed with a needle driver and million dollar graspers.  Using a 9 inch V. lock suture, the cuff was closed by incorporating the anterior  and posterior vaginal mucosa in each stitch. This was carried across all the way to the left corner and a running fashion. Two stitches were brought back towards the midline and the suture was cut flush with the vagina. The needle was brought out the pelvis. The pelvis was irrigated. All pedicles were inspected. No bleeding was noted.  Arista was sprinkled throughout the pelvis and on the omentum due to the extensive dissection.    At this point the procedure was completed.  The pneumoperiteonuem was relieved, ports removed, and the patient taken out of Trendelenberg positioning.  Several deep breaths were given to the patient's trying to any gas the abdomen before the final port was removed.    The skin was then closed with subcuticular stitches of 4-0 Vicryl. The skin was cleansed Dermabond was  applied. Attention was then turned the vagina.  Urine was still clear at this point.  The uterus was removed from the vagina and the cuff was inspected. No bleeding was noted. The anterior posterior vaginal mucosa was incorporated in each stitch.   Then I noticed that the urine was pink tinged.  There was nothing that had been done except removed of the uterus.  Cystoscopy was planned due to the extensive bladder dissection so this was performed.  The Foley catheter was removed.  Cystoscopy was performed.  No sutures or bladder injuries were noted.  There was a small area of abrasion on the region of the bladder just above the ureters and in the midline.  This was superficial and no additional bleeding was noted.  Ureters were noted with normal and strong urine jets of clear urine from each one was seen.  Cystoscopy was ended and cystoscopic fluid was drained.  Foley was replaced to straight drain.  Urine was clear at this point and remained clear.  Sponge, lap, needle, initially counts were correct x2. Patient tolerated the procedure very well. She was awakened from anesthesia, extubated and taken to recovery in stable condition.   Total surgical time was 4 hours.  This was 2+ hours longer than an average total laparoscopic hysterectomy takes due to the extensive adhesions that were present.    COUNTS:  YES  PLAN OF CARE: Transfer to PACU

## 2016-12-22 NOTE — OR Nursing (Signed)
REDNESS ON THE EDGES ON THE ABDOMEN WHERE THE DRAPE WAS PLACED. SLIGHT REDNESS ON THE RIGHT THIGH WHERE BOVIE PAD HAD BEEN.

## 2016-12-22 NOTE — H&P (Signed)
Jamie Lyons is an 41 y.o. female  G4P2 MWF here for definitive treatment of menorrhagia.  Pt initially came desiring endometrial ablation but with evaluation she was noted to have a very thin lower uterine segment beneath the bladder.  I felt ablation had increased risks for bladder injury.  She is not desirous of any hormonal methods for treatment of her bleeding so she is ready to proceed with definitive treatment.  Risks, benefits, alteratives have been discussed.  She is here and ready to proceed.  Pertinent Gynecological History: Menses: menorrhagia Contraception: none and long term infertility DES exposure: denies Blood transfusions: none Sexually transmitted diseases: no past history Previous GYN Procedures: none  Last mammogram: never had one Date: n/a Last pap: normal Date: 12/18/15 OB History: G4, P2   Menstrual History: Menarche age:  Patient's last menstrual period was 11/26/2016 (exact date).    Past Medical History:  Diagnosis Date  . Abnormal uterine bleeding   . Anemia   . Anxiety   . Asthma   . Fibroid   . GERD (gastroesophageal reflux disease)   . Headache   . PONV (postoperative nausea and vomiting)     Past Surgical History:  Procedure Laterality Date  . CESAREAN SECTION     2003, 2009    Family History  Problem Relation Age of Onset  . Melanoma Mother   . Hypertension Mother   . Hyperlipidemia Mother   . Breast cancer Mother   . Heart attack Father   . Neuropathy Father   . Hyperlipidemia Father   . Hypertension Father   . Breast cancer Maternal Grandmother     Social History:  reports that she quit smoking about 12 months ago. She has never used smokeless tobacco. She reports that she drinks about 2.4 oz of alcohol per week . She reports that she does not use drugs.  Allergies: No Known Allergies  Prescriptions Prior to Admission  Medication Sig Dispense Refill Last Dose  . albuterol (PROVENTIL HFA;VENTOLIN HFA) 108 (90 Base) MCG/ACT  inhaler Inhale 2 puffs into the lungs daily as needed for wheezing or shortness of breath.    Past Month at Unknown time  . cetirizine (ZYRTEC) 10 MG tablet Take 10 mg by mouth daily as needed for allergies (alternates between zyrtec and allegra).    12/21/2016 at 1000  . clonazePAM (KLONOPIN) 0.5 MG tablet Take 0.5 mg by mouth daily as needed for anxiety.   12/22/2016 at 0330  . diphenhydramine-acetaminophen (TYLENOL PM) 25-500 MG TABS tablet Take 1 tablet by mouth at bedtime as needed.   12/21/2016 at 1900  . ibuprofen (ADVIL,MOTRIN) 800 MG tablet Take 1 tablet (800 mg total) by mouth every 8 (eight) hours as needed. 30 tablet 0 Past Week at Unknown time  . Omega-3 Fatty Acids (FISH OIL) 500 MG CAPS Take 1,000 mg by mouth daily.    Past Week at Unknown time  . oxyCODONE-acetaminophen (PERCOCET) 5-325 MG tablet Take 2 tablets by mouth every 4 (four) hours as needed. use only as much as needed to relieve pain 30 tablet 0 Past Week at Unknown time  . Probiotic Product (PROBIOTIC PO) Take 1 tablet by mouth daily. Probiotic Restore Ultra   Past Week at Unknown time  . tetrahydrozoline-zinc (VISINE-AC) 0.05-0.25 % ophthalmic solution Place 2 drops into both eyes 3 (three) times daily as needed (dry eyes).   12/22/2016 at 0330  . valACYclovir (VALTREX) 1000 MG tablet Take 2 tablets by mouth 2 (two) times daily as  needed (outbreaks).    Past Month at Unknown time  . fexofenadine (ALLEGRA) 180 MG tablet Take 180 mg by mouth daily. Alternates between zyrtec and allegra   More than a month at Unknown time    Review of Systems  All other systems reviewed and are negative.   Blood pressure 109/65, pulse 73, temperature 98.8 F (37.1 C), temperature source Oral, resp. rate 18, height 5\' 6"  (1.676 m), weight 144 lb (65.3 kg), last menstrual period 11/26/2016, SpO2 100 %. Physical Exam  Constitutional: She is oriented to person, place, and time. She appears well-developed and well-nourished.  Cardiovascular:  Normal rate and regular rhythm.   Respiratory: Effort normal and breath sounds normal.  Neurological: She is alert and oriented to person, place, and time.  Skin: Skin is warm and dry.  Psychiatric: She has a normal mood and affect.    Results for orders placed or performed during the hospital encounter of 12/22/16 (from the past 24 hour(s))  Pregnancy, urine     Status: None   Collection Time: 12/22/16  6:00 AM  Result Value Ref Range   Preg Test, Ur NEGATIVE NEGATIVE    No results found.  Assessment/Plan: 41 yo G4P2 MWF with menorrhagia and thin lower uterine segment here for definitive treatment with TLH/Bilateral salpingectomy/possible BSO and cystoscopy.  All questions answered.  Pt ready to proceed.  [name] 12/22/2016, 7:06 AM

## 2016-12-22 NOTE — Progress Notes (Signed)
Day of Surgery Procedure(s) (LRB): HYSTERECTOMY TOTAL LAPAROSCOPIC (N/A) LAPAROSCOPIC BILATERAL SALPINGECTOMY (Bilateral) CYSTOSCOPY (N/A)  Subjective: Patient reports she is having some nausea.  Pain control is good.  Is not hungry.  D/W surgery and findings.  Questions answered.  Objective: I have reviewed patient's vital signs, intake and output, medications and labs.  General: alert and cooperative Resp: clear to auscultation bilaterally Cardio: regular rate and rhythm, S1, S2 normal, no murmur, click, rub or gallop GI: normal findings: soft, appropriately tender, occ BS and incision: clean, dry and intact Extremities: extremities normal, atraumatic, no cyanosis or edema and and SCDs present and functioning Vaginal Bleeding: none  Assessment: s/p Procedure(s): HYSTERECTOMY TOTAL LAPAROSCOPIC (N/A) LAPAROSCOPIC BILATERAL SALPINGECTOMY (Bilateral) CYSTOSCOPY (N/A): stable and progressing well  Plan: Continue SCDs, IVF, PCA  Encouraged ambulation Manage nausea with phenergan  LOS: 0 days    [name] 12/22/2016, 6:18 PM

## 2016-12-22 NOTE — OR Nursing (Signed)
Husband called per Dr. Sabra Heck with an update @0810 .

## 2016-12-22 NOTE — Anesthesia Procedure Notes (Signed)
Procedure Name: Intubation Date/Time: 12/22/2016 7:34 AM Performed by: Gilmer Mor R Pre-anesthesia Checklist: Patient identified, Patient being monitored, Timeout performed, Emergency Drugs available and Suction available Patient Re-evaluated:Patient Re-evaluated prior to inductionOxygen Delivery Method: Circle System Utilized Preoxygenation: Pre-oxygenation with 100% oxygen Intubation Type: IV induction Ventilation: Mask ventilation without difficulty Laryngoscope Size: Mac and 3 Grade View: Grade II Tube type: Oral Tube size: 7.0 mm Number of attempts: 1 Airway Equipment and Method: stylet Placement Confirmation: ETT inserted through vocal cords under direct vision,  positive ETCO2 and breath sounds checked- equal and bilateral Secured at: 21 cm Tube secured with: Tape Dental Injury: Teeth and Oropharynx as per pre-operative assessment

## 2016-12-22 NOTE — Transfer of Care (Signed)
Immediate Anesthesia Transfer of Care Note  Patient: Jamie Lyons  Procedure(s) Performed: Procedure(s): HYSTERECTOMY TOTAL LAPAROSCOPIC (N/A) LAPAROSCOPIC BILATERAL SALPINGECTOMY (Bilateral) CYSTOSCOPY (N/A)  Patient Location: PACU  Anesthesia Type:General  Level of Consciousness: awake, alert  and oriented  Airway & Oxygen Therapy: Patient Spontanous Breathing and Patient connected to nasal cannula oxygen  Post-op Assessment: Report given to RN and Post -op Vital signs reviewed and stable BP 120/65, HR 109, RR 16, Sao2 100%  Post vital signs: Reviewed and stable  Last Vitals:  Vitals:   12/22/16 0609  BP: 109/65  Pulse: 73  Resp: 18  Temp: 37.1 C    Last Pain:  Vitals:   12/22/16 0609  TempSrc: Oral      Patients Stated Pain Goal: 3 ([id number])  Complications: No apparent anesthesia complications

## 2016-12-23 ENCOUNTER — Encounter (HOSPITAL_COMMUNITY): Payer: Self-pay | Admitting: Obstetrics & Gynecology

## 2016-12-23 DIAGNOSIS — N920 Excessive and frequent menstruation with regular cycle: Secondary | ICD-10-CM | POA: Diagnosis not present

## 2016-12-23 LAB — CBC
HEMATOCRIT: 28.3 % — AB (ref 36.0–46.0)
HEMOGLOBIN: 10.1 g/dL — AB (ref 12.0–15.0)
MCH: 34.1 pg — ABNORMAL HIGH (ref 26.0–34.0)
MCHC: 35.7 g/dL (ref 30.0–36.0)
MCV: 95.6 fL (ref 78.0–100.0)
Platelets: 147 10*3/uL — ABNORMAL LOW (ref 150–400)
RBC: 2.96 MIL/uL — AB (ref 3.87–5.11)
RDW: 13.3 % (ref 11.5–15.5)
WBC: 7.6 10*3/uL (ref 4.0–10.5)

## 2016-12-23 MED ORDER — HYDROMORPHONE HCL 2 MG PO TABS: 2.0000 mg | ORAL_TABLET | ORAL | 0 refills | Status: DC | PRN

## 2016-12-23 MED ORDER — PANTOPRAZOLE SODIUM 40 MG PO TBEC: 40.0000 mg | DELAYED_RELEASE_TABLET | Freq: Every day | ORAL | Status: DC

## 2016-12-23 MED ORDER — PROMETHAZINE HCL 12.5 MG PO TABS: 12.5000 mg | ORAL_TABLET | ORAL | 0 refills | Status: DC | PRN

## 2016-12-23 MED ORDER — IBUPROFEN 800 MG PO TABS
800.0000 mg | ORAL_TABLET | Freq: Three times a day (TID) | ORAL | Status: DC | PRN
  Administered 2016-12-23: 800 mg via ORAL
  Filled 2016-12-23: qty 1

## 2016-12-23 MED ORDER — HYDROMORPHONE HCL 2 MG PO TABS
2.0000 mg | ORAL_TABLET | ORAL | Status: DC | PRN
  Administered 2016-12-23 (×4): 2 mg via ORAL
  Filled 2016-12-23 (×4): qty 1

## 2016-12-23 NOTE — Discharge Instructions (Signed)
Post Op Hysterectomy Instructions Please read the instructions below. Refer to these instructions for the next few weeks. These instructions provide you with general information on caring for yourself after surgery. Your caregiver may also give you specific instructions. While your treatment has been planned according to the most current medical practices available, unavoidable problems sometimes happen. If you have any problems or questions after you leave, please call your caregiver.  HOME CARE INSTRUCTIONS Healing will take time. You will have discomfort, tenderness, swelling and bruising at the operative site for a couple of weeks. This is normal and will get better as time goes on.   Only take over-the-counter or prescription medicines for pain, discomfort or fever as directed by your caregiver.   Do not take aspirin. It can cause bleeding.   Do not drive when taking pain medication.   Follow your caregivers advice regarding diet, exercise, lifting, driving and general activities.   Resume your usual diet as directed and allowed.   Get plenty of rest and sleep.   Do not douche, use tampons, or have sexual intercourse until your caregiver gives you permission. .   Take your temperature if you feel hot or flushed.   You may shower today when you get home.  No tub bath for one week.    Do not drink alcohol until you are not taking any narcotic pain medications.   Try to have someone home with you for a week or two to help with the household activities.   Be careful over the next two to three weeks with any activities at home that involve lifting, pushing, or pulling.  Listen to your body--if something feels uncomfortable to do, then don't do it.  Make sure you and your family understands everything about your operation and recovery.   Walking up stairs is fine.  Do not sign any legal documents until you feel normal again.   Keep all your follow-up appointments as recommended by  your caregiver.   PLEASE CALL THE OFFICE IF:  There is swelling, redness or increasing pain in the wound area.   Pus is coming from the wound.   You notice a bad smell from the wound or surgical dressing.   You have pain, redness and swelling from the intravenous site.   The wound is breaking open (the edges are not staying together).    You develop pain or bleeding when you urinate.   You develop abnormal vaginal discharge.   You have any type of abnormal reaction or develop an allergy to your medication.   You need stronger pain medication for your pain   SEEK IMMEDIATE MEDICAL CARE:  You develop a temperature of 100.5 or higher.   You develop abdominal pain.   You develop chest pain.   You develop shortness of breath.   You pass out.   You develop pain, swelling or redness of your leg.   You develop heavy vaginal bleeding with or without blood clots.   MEDICATIONS:  Restart your regular medications BUT wait one week before restarting all vitamins and mineral supplements  Use Motrin 800mg  every 8 hours for the next several days.  This will help you use less Dilaudid.  You can take the dilaudid every 2 hours but you should be increasing the interval of time between the pain medication.    You should start Colace 100mg  twice daily.  If you have not had a bowel movement by Thursday, start daily miralax as well.  Warm  liquids, fluids, and ambulation help too.  If you have not had a bowel movement in four days, you need to call the office.

## 2016-12-23 NOTE — Progress Notes (Signed)
The patient is receiving Protonix by the intravenous route.  Based on criteria approved by the Pharmacy and [facility], the medication is being converted to the equivalent oral dose form.  These criteria include: -No Active GI bleeding -Able to tolerate diet of full liquids (or better) or tube feeding -Able to tolerate other medications by the oral or enteral route  If you have any questions about this conversion, please contact the Pharmacy Department (ext [phone]).  Thank you.  Beryle Lathe 12/23/2016

## 2016-12-23 NOTE — Progress Notes (Signed)
Subjective: Patient is feeling better.  Nausea is much better.  She has voided multiple times.  She has walked in the halls several times.  She has passed gas x 2.  She has eating without difficulty.  She is on oral pain medications.  Pt ready for d/c.  Objective: I have reviewed patient's vital signs, intake and output, medications and labs. Vitals:   12/23/16 1145 12/23/16 1602  BP: (!) 95/50 (!) 94/51  Pulse: 80 69  Resp: 16 16  Temp: 98.4 F (36.9 C) 98.6 F (37 C)    General: alert and cooperative Resp: clear to auscultation bilaterally Cardio: regular rate and rhythm, S1, S2 normal, no murmur, click, rub or gallop GI: normal findings: soft, mildly tender, normal BS, mild distension Extremities: extremities normal, atraumatic, no cyanosis or edema Vaginal Bleeding: none   Assessment/Plan: D/C home  LOS: 0 days    Jamie Lyons Jamie Lyons 12/23/2016, 6:25 PM

## 2016-12-23 NOTE — Discharge Summary (Signed)
Physician Discharge Summary  Patient ID: Jamie Lyons MRN: [id number] DOB/AGE: 1976-08-29 41 y.o.  Admit date: 12/22/2016 Discharge date: 12/23/2016  Admission Diagnoses: menorrhagia  Discharge Diagnoses:  Menorrhagia, significant pelvic adhesions  Discharged Condition: good  Hospital Course: Patient admitted through same day surgery.  She was taken to OR where TLH/bilateral salpingectomy/cystoscopy/extensive LOA were performed.  Surgical findings included significant pelvic adhesions with the uterus adhered to the anterior abdominal wall as well as the bladder adhered to the uterus.  Surgery was uneventful however took about four hours to complete.  EBL 200cc.  Foley catheter was left in place after procedure was ended.  Patient transferred to PACU where she was stable and then to 3rd floor for the remainder of her hospitalization.  During her post-op recovery, her vitals and stable and she was AF.  In evening of POD#0, she had significant nausea and ended up needing phenergan for this.  Nausea significantly improved however she was sleepy with this so was unable to stand or walk in the evening of POD#0.  In the morning of POD#1, she was transition to oral pain medications and regular diet.  She was able to ambulate and she had adequate pain control.  She was also able to void on her own.  She also passed flatus during the day.  Patient seen both in the evening of POD#0 and AM and PM of POD#1.  By the evening of POD#1, she was meeting all criteria for discharge.  Post op hb was 10.1, decreased from 12.5, pre-operatively.  She was felt ready for D/C.  Consults: None  Significant Diagnostic Studies: labs: post op hb 10.1  Treatments: surgery: TLH/Bilateral salpingectomy/cystoscopy/extensis LOA  Discharge Exam: Blood pressure (!) 94/51, pulse 69, temperature 98.6 F (37 C), temperature source Oral, resp. rate 16, height 5\' 6"  (1.676 m), weight 144 lb (65.3 kg), last menstrual period 11/26/2016,  SpO2 93 %. General appearance: alert and cooperative Resp: clear to auscultation bilaterally Cardio: regular rate and rhythm, S1, S2 normal, no murmur, click, rub or gallop GI: soft, appropriately tender, +BS, mild distension Extremities: extremities normal, atraumatic, no cyanosis or edema Incision/Wound: C/D/I  Disposition: Final discharge disposition not confirmed   Allergies as of 12/23/2016   No Known Allergies     Medication List    STOP taking these medications   oxyCODONE-acetaminophen 5-325 MG tablet Commonly known as:  PERCOCET     TAKE these medications   albuterol 108 (90 Base) MCG/ACT inhaler Commonly known as:  PROVENTIL HFA;VENTOLIN HFA Inhale 2 puffs into the lungs daily as needed for wheezing or shortness of breath.   cetirizine 10 MG tablet Commonly known as:  ZYRTEC Take 10 mg by mouth daily as needed for allergies (alternates between zyrtec and allegra).   clonazePAM 0.5 MG tablet Commonly known as:  KLONOPIN Take 0.5 mg by mouth daily as needed for anxiety.   diphenhydramine-acetaminophen 25-500 MG Tabs tablet Commonly known as:  TYLENOL PM Take 1 tablet by mouth at bedtime as needed.   fexofenadine 180 MG tablet Commonly known as:  ALLEGRA Take 180 mg by mouth daily. Alternates between zyrtec and allegra   Fish Oil 500 MG Caps Take 1,000 mg by mouth daily.   HYDROmorphone 2 MG tablet Commonly known as:  DILAUDID Take 1 tablet (2 mg total) by mouth every 4 (four) hours as needed.   ibuprofen 800 MG tablet Commonly known as:  ADVIL,MOTRIN Take 1 tablet (800 mg total) by mouth every 8 (eight) hours as  needed.   PROBIOTIC PO Take 1 tablet by mouth daily. Probiotic Restore Ultra   promethazine 12.5 MG tablet Commonly known as:  PHENERGAN Take 1 tablet (12.5 mg total) by mouth every 4 (four) hours as needed for nausea or vomiting.   tetrahydrozoline-zinc 0.05-0.25 % ophthalmic solution Commonly known as:  VISINE-AC Place 2 drops into both  eyes 3 (three) times daily as needed (dry eyes).   valACYclovir 1000 MG tablet Commonly known as:  VALTREX Take 2 tablets by mouth 2 (two) times daily as needed (outbreaks).        SignedLyman Speller 12/23/2016, 6:31 PM

## 2016-12-23 NOTE — Progress Notes (Signed)
Discharge instructions given to patient and husband. Patient verbalized an understanding of discharge instructions. Discharged via wheelchair with husband.

## 2016-12-23 NOTE — Progress Notes (Signed)
1 Day Post-Op Procedure(s) (LRB): HYSTERECTOMY TOTAL LAPAROSCOPIC (N/A) LAPAROSCOPIC BILATERAL SALPINGECTOMY (Bilateral) CYSTOSCOPY (N/A)  Subjective: Patient reports some mild nausea.  Pain control is good.  Has voided.  Has walked in the halls.   Objective: I have reviewed patient's vital signs, intake and output, medications and labs.  General: alert and cooperative Resp: clear to auscultation bilaterally Cardio: regular rate and rhythm, S1, S2 normal, no murmur, click, rub or gallop GI: soft, non-tender; bowel sounds normal; no masses,  no organomegaly and incision: clean, dry and intact Extremities: extremities normal, atraumatic, no cyanosis or edema Vaginal Bleeding: none  Assessment: s/p Procedure(s): HYSTERECTOMY TOTAL LAPAROSCOPIC (N/A) LAPAROSCOPIC BILATERAL SALPINGECTOMY (Bilateral) CYSTOSCOPY (N/A): stable  Plan: Advance diet Encourage ambulation Advance to PO medication Discontinue IV fluids  LOS: 0 days    Jamie Lyons SUZANNE 12/23/2016, 8:33 AM

## 2016-12-29 ENCOUNTER — Ambulatory Visit (INDEPENDENT_AMBULATORY_CARE_PROVIDER_SITE_OTHER): Payer: Managed Care, Other (non HMO) | Admitting: Obstetrics & Gynecology

## 2016-12-29 ENCOUNTER — Encounter: Payer: Self-pay | Admitting: Obstetrics & Gynecology

## 2016-12-29 VITALS — BP 92/60 | HR 62 | Resp 12 | Ht 66.0 in | Wt 143.0 lb

## 2016-12-29 DIAGNOSIS — Z9889 Other specified postprocedural states: Secondary | ICD-10-CM

## 2016-12-29 MED ORDER — IBUPROFEN 800 MG PO TABS: 800.0000 mg | ORAL_TABLET | Freq: Three times a day (TID) | ORAL | 0 refills | Status: DC | PRN

## 2016-12-29 NOTE — Progress Notes (Signed)
Post Operative Visit  Procedure: HYSTERECTOMY TOTAL LAPAROSCOPIC, LAPAROSCOPIC BILATERAL SALPINGECTOMY, CYSTOSCOPY    Days Post-op: 7 days   Subjective: Doing well.  Has had two bowel movements.  Still taking stool softeners.  Bladder function is improved.  Pt feels that she is able to hold her urine for longer periods of time and she is feeling like she has a much stronger void.    She had a little bit of spotting post operatively that stopped on Friday.    Denies fevers.  No nausea.    She is still taking her Dilaudid but going six to ten hours between them.  She is still taking her Motrin.   Objective: BP 92/60 (BP Location: Right Arm, Patient Position: Sitting, Cuff Size: Normal)   Pulse 62   Resp 12   Ht 5\' 6"  (1.676 m)   Wt 143 lb (64.9 kg)   LMP 11/26/2016   BMI 23.08 kg/m   EXAM General: alert, cooperative and no distress Resp: clear to auscultation bilaterally Cardio: regular rate and rhythm, S1, S2 normal, no murmur, click, rub or gallop GI: soft, non-tender; bowel sounds normal; no masses,  no organomegaly and incision: clean, dry and intact Extremities: extremities normal, atraumatic, no cyanosis or edema Vaginal Bleeding: none  Gyn:  NAEFG, vagina without lesions, minimal discharge, cuff non tender, no fullness  Assessment: s/p TLH/bilateral salpingectomy/cystoscopy, extensive LOA  Plan: Recheck 3 weeks Pt is going to continue to work on weaning off oral narcotics.  RF for Motrin 800mg , one tab every 8 hours as needed given.  #30/0RF. Pt will follow up in three weeks

## 2017-01-12 ENCOUNTER — Ambulatory Visit (INDEPENDENT_AMBULATORY_CARE_PROVIDER_SITE_OTHER): Payer: Managed Care, Other (non HMO) | Admitting: Obstetrics & Gynecology

## 2017-01-12 ENCOUNTER — Telehealth: Payer: Self-pay | Admitting: Obstetrics & Gynecology

## 2017-01-12 ENCOUNTER — Encounter: Payer: Self-pay | Admitting: Obstetrics & Gynecology

## 2017-01-12 VITALS — BP 112/72 | HR 60 | Resp 14 | Ht 66.0 in | Wt 144.0 lb

## 2017-01-12 DIAGNOSIS — K649 Unspecified hemorrhoids: Secondary | ICD-10-CM

## 2017-01-12 DIAGNOSIS — N343 Urethral syndrome, unspecified: Secondary | ICD-10-CM | POA: Diagnosis not present

## 2017-01-12 DIAGNOSIS — E079 Disorder of thyroid, unspecified: Secondary | ICD-10-CM | POA: Insufficient documentation

## 2017-01-12 DIAGNOSIS — R102 Pelvic and perineal pain: Secondary | ICD-10-CM

## 2017-01-12 DIAGNOSIS — M6289 Other specified disorders of muscle: Secondary | ICD-10-CM

## 2017-01-12 LAB — POCT URINALYSIS DIPSTICK
BILIRUBIN UA: NEGATIVE
Blood, UA: NEGATIVE
GLUCOSE UA: NEGATIVE
Ketones, UA: NEGATIVE
LEUKOCYTES UA: NEGATIVE
NITRITE UA: NEGATIVE
PH UA: 5
Protein, UA: NEGATIVE
Urobilinogen, UA: NEGATIVE

## 2017-01-12 MED ORDER — HYDROCORTISONE 2.5 % RE CREA: 1.0000 "application " | TOPICAL_CREAM | Freq: Four times a day (QID) | RECTAL | 1 refills | Status: AC

## 2017-01-12 MED ORDER — IBUPROFEN 800 MG PO TABS: 800.0000 mg | ORAL_TABLET | Freq: Three times a day (TID) | ORAL | 0 refills | Status: DC | PRN

## 2017-01-12 MED ORDER — CELECOXIB 100 MG PO CAPS: 100.0000 mg | ORAL_CAPSULE | Freq: Two times a day (BID) | ORAL | 0 refills | Status: DC

## 2017-01-12 MED ORDER — CYCLOBENZAPRINE HCL 10 MG PO TABS: 10.0000 mg | ORAL_TABLET | Freq: Every day | ORAL | 0 refills | Status: DC

## 2017-01-12 NOTE — Telephone Encounter (Signed)
Patient had TLH on 12/22/2016. 3 weeks post op today. Reports she is still having pain that is a 5/10. Taking Ibuprofen 800 mg every 6 hours. Pain is a constant dull ache with intermittent sharp shooting pain. Stopped taking Percocet last week due to constipation. Has been taking daily stool softener and is having regular bowel movements. Drove this morning for the first time without problems. Reports pain with driving over bumps in the road. Feels safe to drive otherwise. Reports incisions are healing well without drainage or swelling. Feels a "heaviness" in her lower abdomen. Advised she will need to be seen in the office for further evaluation. Patient is agreeable. Patient will head to the office now to be worked in with [facility] per Lamont Snowball, RN.  Routing to provider for final review. Patient agreeable to disposition. Will close encounter.

## 2017-01-12 NOTE — Progress Notes (Signed)
GYNECOLOGY  VISIT   HPI: 41 y.o. G50P2002 Married Caucasian female who is three weeks post op from TLH/bilateral salpingectomy/cystoscopy and extensive LOA here after calling this morning with complaint of pain.  Reports she stopped her narcotics about a week ago.  Has been using on Motrin but feels she needs it every 6-8 hours.  Has started to drive and do things around the house.  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    Reports with much activity, she does have some fatigue.  Energy is better than right after surgery, however.  Denies vaginal bleeding or discharge.  Denies fever.  Denies urinary issues.  Voiding without difficulty.  Having regular bowel movements.  Reports this is much better than before surgery.  She take two stool softeners every morning and she is having regular BMs.  Having some issues with hemorrhoids due to constipation post operatively. Using topical lidocaine.  Has not been using any steroid.  GYNECOLOGIC HISTORY: No LMP recorded. Contraception: hysterectomy  Patient Active Problem List   Diagnosis Date Noted  . Asthma 09/09/2016  . Anxiety state 09/09/2016  . Fever blister 09/09/2016  . Enteritis due to Clostridium difficile 09/09/2016    Past Medical History:  Diagnosis Date  . Abnormal uterine bleeding   . Anemia   . Anxiety   . Asthma   . Fibroid   . GERD (gastroesophageal reflux disease)   . Headache   . PONV (postoperative nausea and vomiting)     Past Surgical History:  Procedure Laterality Date  . CESAREAN SECTION     2003, 2009  . CYSTOSCOPY N/A 12/22/2016   Procedure: CYSTOSCOPY;  Surgeon: [name], MD;  Location: [facility];  Service: Gynecology;  Laterality: N/A;  . LAPAROSCOPIC BILATERAL SALPINGECTOMY Bilateral 12/22/2016   Procedure: LAPAROSCOPIC BILATERAL SALPINGECTOMY;  Surgeon: [name], MD;  Location: [facility];  Service: Gynecology;  Laterality: Bilateral;  . LAPAROSCOPIC HYSTERECTOMY N/A 12/22/2016   Procedure: HYSTERECTOMY TOTAL LAPAROSCOPIC;  Surgeon: [name], MD;  Location: [facility];  Service: Gynecology;  Laterality: N/A;    MEDS:  Reviewed in EPIC and UTD  ALLERGIES: Patient has no known allergies.  Family History  Problem Relation Age of Onset  . Melanoma Mother   . Hypertension Mother   . Hyperlipidemia Mother   . Breast cancer Mother   . Heart attack Father   . Neuropathy Father   . Hyperlipidemia Father   . Hypertension Father   . Breast cancer Maternal Grandmother     SH:  Married, non smoker  Review of Systems  All other systems reviewed and are negative.   PHYSICAL EXAMINATION:    BP 112/72 (BP Location: Right Arm, Patient Position: Sitting, Cuff Size: Normal)   Pulse 60   Resp 14   Ht 5\' 6"  (1.676 m)   Wt 144 lb (65.3 kg)   BMI 23.24 kg/m     General appearance: alert, cooperative and appears stated age Lungs:  clear to auscultation, no wheezes, rales or rhonchi, symmetric air entry Breasts: normal appearance, no masses or tenderness Abdomen: soft, non-tender; bowel sounds normal; no masses,  no organomegaly  Inc:  C/D/I, healing well  Pelvic: External genitalia:  no lesions              Urethra:  normal appearing urethra with no masses, tenderness or lesions              Bartholins and Skenes: normal  Vagina: normal appearing vagina with normal color and discharge, no lesions, cuff well approximated              Cervix: absent and non tender to palpation of cuff, no fullness              Bimanual Exam:  Uterus:  uterus absent, significant tenderness to pelvic floor, bilaterally              Adnexa: no mass, fullness, tenderness              Rectovaginal: No.              Anus:  no lesions, small hemorrhoid  Chaperone was present for exam.  Assessment: Pelvic floor pain Hemorrhoid 3 weeks post op from TLH/extensive LOA, bilateral salpingectomy, cystoscopy  Plan: Will try Celebrex 100mg  BID for the next week or two.  Rx  given.  Also RF'ed Motrin RX in case this works better for her. Flexeril 10mg  nightly.  Will need to start pelvic PT but will have to wait at least four weeks post op and maybe long.  Will need to check into pelvic PT nearer to her home Rx for anusol HC 2.5% cream QID prn hemorrhoids

## 2017-01-12 NOTE — Patient Instructions (Signed)
You can use the celebrex 100mg  twice daily OR the motrin--whichever seems to work better.

## 2017-01-12 NOTE — Telephone Encounter (Signed)
Patient is asking to talk with Dr.Miller's nurse about some symptoms she is having after her recent surgery.

## 2017-01-23 ENCOUNTER — Ambulatory Visit: Payer: Managed Care, Other (non HMO) | Admitting: Obstetrics & Gynecology

## 2017-02-02 ENCOUNTER — Ambulatory Visit: Payer: Managed Care, Other (non HMO) | Admitting: Obstetrics & Gynecology

## 2017-02-03 ENCOUNTER — Encounter: Payer: Self-pay | Admitting: Obstetrics & Gynecology

## 2017-02-03 ENCOUNTER — Ambulatory Visit (INDEPENDENT_AMBULATORY_CARE_PROVIDER_SITE_OTHER): Payer: Managed Care, Other (non HMO) | Admitting: Obstetrics & Gynecology

## 2017-02-03 VITALS — BP 108/60 | HR 76 | Resp 12 | Ht 66.0 in | Wt 145.8 lb

## 2017-02-03 DIAGNOSIS — N343 Urethral syndrome, unspecified: Secondary | ICD-10-CM

## 2017-02-03 DIAGNOSIS — M6289 Other specified disorders of muscle: Secondary | ICD-10-CM

## 2017-02-03 DIAGNOSIS — Z9889 Other specified postprocedural states: Secondary | ICD-10-CM

## 2017-02-03 MED ORDER — CYCLOBENZAPRINE HCL 10 MG PO TABS: 10.0000 mg | ORAL_TABLET | Freq: Every day | ORAL | 0 refills | Status: DC

## 2017-02-03 MED ORDER — CELECOXIB 100 MG PO CAPS: 100.0000 mg | ORAL_CAPSULE | Freq: Two times a day (BID) | ORAL | 0 refills | Status: DC

## 2017-02-03 NOTE — Progress Notes (Signed)
Post Operative Visit  Procedure: HYSTERECTOMY TOTAL LAPAROSCOPIC, LAPAROSCOPIC BILATERAL SALPINGECTOMY, CYSTOSCOPY    Days Post-op: 6 weeks, 1 day  Subjective: Doing well.  Reports pain is much better.  Reports her energy is better.  Thinks the celebrex really helps and/or the flexeril.  Denies vaginal bleeding.  Not going to work in the end of March.  Reports her bladder function is much better.  Hemorrhoid is much better.  Taking stool softeners at night only and having a regular bowel movement.    Objective: LMP 11/26/2016   EXAM General: alert and cooperative Resp: clear to auscultation bilaterally Cardio: regular rate and rhythm, S1, S2 normal, no murmur, click, rub or gallop GI: soft, non-tender; bowel sounds normal; no masses,  no organomegaly and incision: clean, dry and intact Extremities: extremities normal, atraumatic, no cyanosis or edema Vaginal Bleeding: none  Gyn:  NAEFG, vaginal pink and moist, cuff well approximated, non tender, no masses or fullness, continued tenderness of pelvic floor which reproduces pain pt is experiencing  Assessment: s/p TLH/bilateral salpingectomy/cystoscopy with extensive LOA due to two prior cesarean sections Pelvic floor tenderness/dysfunction  Plan: Continue celebrex but will decrease to 100mg  daily.  Rx to pharmacy.  Rx for flexeril 10mg  nightly, prn as well. Referral to pelvic PT.  Hopefully this will allow pt to wean off the above medication.  Will plan follow up after PT starts.

## 2017-02-05 ENCOUNTER — Telehealth: Payer: Self-pay | Admitting: Obstetrics & Gynecology

## 2017-02-05 NOTE — Telephone Encounter (Signed)
Patient saw Dr Sabra Heck earlier this week and the physical therapist she was referred to cannot see her until end of May.  She would like to know if there is anyone else she can see.

## 2017-02-06 NOTE — Telephone Encounter (Signed)
Spoke with patient. Advised of message as seen below from [place]. Patient states that she is not able to travel to [place] today, but can next week. Appointment made with [facility] on 02/11/2017 at 12 pm with Myrla Halsted, PT, LMBT. [facility] will check the patient's benefits and contact her regarding coverage for PT. Patient is agreeable to date and time. Information to [facility] office provided to patient as seen below. OV notes from 01/12/2017 and 02/03/2017 faxed to [facility] for patient's appointment at [phone] with cover sheet and confirmation.  [place], [place] [phone]  Routing to provider for final review. Patient agreeable to disposition. Will close encounter.

## 2017-02-06 NOTE — Telephone Encounter (Signed)
If she is willing to come here, she is she can come today and do the referral to [facility].  Thank you!!

## 2017-02-06 NOTE — Telephone Encounter (Signed)
Spoke with patient. Patient states that PT for pelvic pain with [facility] is booking out to the end of May. Asking for other recommendations. Patient states she is willing to come to [facility] to be seen. Advised will review with Dr.Miller and return call to ensure we are referring her to a place that will be a good fit. Patient is agreeable.  Called to [facility] off [address] in [place] first PT appointment for pelvic pain is today at 12 pm or 02/11/2017 at 12 pm with female provider Myrla Halsted, PT, LMBT.  Called to Pelvic Floor Rehabilitation with [facility] off [address] in [place]. First available PT appointment for pelvic pain is 02/16/2017.  Dr.Miller, please advise.

## 2017-03-03 ENCOUNTER — Telehealth: Payer: Self-pay | Admitting: Obstetrics & Gynecology

## 2017-03-03 ENCOUNTER — Other Ambulatory Visit: Payer: Self-pay | Admitting: Obstetrics & Gynecology

## 2017-03-03 MED ORDER — VALACYCLOVIR HCL 1 G PO TABS: 2000.0000 mg | ORAL_TABLET | Freq: Two times a day (BID) | ORAL | 0 refills | Status: DC | PRN

## 2017-03-03 NOTE — Telephone Encounter (Signed)
Spoke with patient. Patient states that she is having an outbreak and is out of Valtrex. Reports her previous GYN was writing this prescription for her, but since she has switched to seeing Dr.Miller they will not refill it unless she is seen. Patient has been taking Valtrex 1000 mg 2 tablets twice a day as needed for outbreaks. Advised will review with Dr.Miller and return call. Pharmacy on file is current for patient.  Dr.Miller, okay to send in rx for Valtrex at this time?

## 2017-03-03 NOTE — Telephone Encounter (Signed)
Patient called and requested a prescription for Valtrex be sent to her pharmacy on file.

## 2017-03-03 NOTE — Telephone Encounter (Signed)
Medication refill request: Celebrex/ Flexeril  Last AEX:  N/A Last OV: 02-03-17 Next AEX: not scheduled  Last MMG (if hormonal medication request): N/A Refill authorized: please advise

## 2017-03-03 NOTE — Telephone Encounter (Signed)
Refill done to pharmacy on file.  Ok to close encounter.

## 2017-03-04 NOTE — Telephone Encounter (Signed)
Spoke with patient and she is still doing PT. PT is helping some but she is still having to using the Celebrex daily. She is only using the Flexeril about once a week.

## 2017-03-04 NOTE — Telephone Encounter (Signed)
Can you please contact pt and get an update.  She has been going to physical therapy for at least a few weeks.  I'm wondering if we can start weaning off these medications.  Thanks.

## 2017-07-30 ENCOUNTER — Encounter: Payer: Self-pay | Admitting: Obstetrics & Gynecology

## 2017-09-17 ENCOUNTER — Telehealth: Payer: Self-pay

## 2017-09-17 ENCOUNTER — Encounter: Payer: Self-pay | Admitting: Obstetrics & Gynecology

## 2017-09-17 DIAGNOSIS — R51 Headache: Principal | ICD-10-CM

## 2017-09-17 DIAGNOSIS — R519 Headache, unspecified: Secondary | ICD-10-CM

## 2017-09-17 DIAGNOSIS — R413 Other amnesia: Secondary | ICD-10-CM

## 2017-09-17 NOTE — Telephone Encounter (Signed)
Spoke with patient. Advised of message as seen below from [place]. Patient would like to be seen for lab work in our office and have a referral to neurology. Lab appointment scheduled for 09/22/2017 at 11 am. Patient is agreeable to date and time. Orders placed. Referral placed to [facility]. Appointment scheduled for 11/10/2017 at 10 am with 9:30 am arrival with Dr.Yan. Called to [facility] and earliest appointment is in January.   Reviewed with Dr.Miller. Contacted [facility] in [place] regarding Neurology referral left a message for return call to discuss appointment scheduling for this Per [place] patient may also be seen at a [facility] in [place].

## 2017-09-17 NOTE — Telephone Encounter (Signed)
Telephone encounter created to review with Dr.Miller. 

## 2017-09-17 NOTE — Telephone Encounter (Signed)
Non-Urgent Medical Question  Message [id number]  From Jamie Lyons To [name], MD Sent 09/17/2017 8:29 AM  Hi! I know this is a strange question, but this year I have been experiencing headaches and memory loss. It seems to have become worse as time passes. This morning I awoke with a horrible headache, so bad I had to lie in bed with ice packs covering my head and neck. Not sure if this was a migraine or not. Also my family and friends have commented how I behave like an old senile woman sometimes. I find trouble remembering the little things. This morning I walked downstairs and noticed a device on the wall that I don't remember. I took a picture of it and sent it to my husband. He said it has been there since we have been in this house(almost 3 years). But I don't recall it. It's a motion sensor for our alarm system. A friend said she has been noticing that I tell her the same stories several times over and over. So with all this said, could it be a hormone imbalance? Or should I see a neurologist? The reason I ask you is you are the only Dr. I fully trust. Thank you for your time.   Responsible Party   Pool - [facility] Clinical Pool No one has taken responsibility for this message.  No actions have been taken on this message.   Dr.Miller, okay to place referral to Neurology?

## 2017-09-17 NOTE — Telephone Encounter (Signed)
She absolutely needs a neurology referral.  Please place it and maybe even call for appt.  I'm happy to check hormones as well.  Ok to place order for [facility], estradiol as well.  I'm happy to see her if she desires but definitely neurology.

## 2017-09-17 NOTE — Telephone Encounter (Signed)
Would yo uplease make sure pt is aware that if symptoms worsen, she needs to be seen in the ER for additional and earlier evaluation?  Thanks.

## 2017-09-17 NOTE — Telephone Encounter (Signed)
Left message to call Kaitlyn at [phone]. 

## 2017-09-17 NOTE — Telephone Encounter (Signed)
Call returned from [facility] in [place]. Neurology appointment scheduled for 09/24/2017 at 9 am with Chipper Herb, FNP at [address] [place], [place] [address], phone [phone]. Spoke with patient advised of appointment date, time, and location. Patient is agreeable. Offered for patient to be seen at a local [facility] for labs, but patient declines. Would like to be seen with our office for lab draw. Appointment with [facility] cancelled.  Routing to provider for final review. Patient agreeable to disposition. Will close encounter.

## 2017-09-18 NOTE — Telephone Encounter (Signed)
Spoke with patient. Patient given ER precautions and verbalizes understanding. Encounter previously closed.

## 2017-09-18 NOTE — Telephone Encounter (Signed)
Left message to call Miel Wisener at [phone]. 

## 2017-09-21 ENCOUNTER — Telehealth: Payer: Self-pay | Admitting: Obstetrics & Gynecology

## 2017-09-21 DIAGNOSIS — R413 Other amnesia: Secondary | ICD-10-CM

## 2017-09-21 DIAGNOSIS — R519 Headache, unspecified: Secondary | ICD-10-CM

## 2017-09-21 DIAGNOSIS — R51 Headache: Principal | ICD-10-CM

## 2017-09-21 NOTE — Telephone Encounter (Signed)
Order for [facility] and Estradiol placed and released for patient to have labs drawn in [place]. Patient verbalizes understanding. Address provided to patient [address], [place], [place] [address].  Routing to provider for final review. Patient agreeable to disposition. Will close encounter.

## 2017-09-21 NOTE — Telephone Encounter (Signed)
Patient would like to have her labs done in [place] instead of our office  I did not cancel the labs appointment.

## 2017-09-22 ENCOUNTER — Other Ambulatory Visit: Payer: Managed Care, Other (non HMO)

## 2017-09-23 ENCOUNTER — Telehealth: Payer: Self-pay | Admitting: Obstetrics & Gynecology

## 2017-09-23 ENCOUNTER — Other Ambulatory Visit: Payer: Managed Care, Other (non HMO)

## 2017-09-23 NOTE — Telephone Encounter (Signed)
Left message on voicemail regarding missed lab appointment. °

## 2017-09-23 NOTE — Telephone Encounter (Signed)
Thanks.  Ok to close encounter. 

## 2017-09-24 LAB — ESTRADIOL: Estradiol: 61.1 pg/mL

## 2017-09-24 LAB — FOLLICLE STIMULATING HORMONE: FSH: 5.4 m[IU]/mL

## 2017-09-29 ENCOUNTER — Telehealth: Payer: Self-pay

## 2017-09-29 NOTE — Telephone Encounter (Signed)
Left message to call Kaitlyn at [phone]. 

## 2017-09-29 NOTE — Telephone Encounter (Signed)
Spoke with patient. Results given. Patient verbalizes understanding. Patient states she had to reschedule her neurology appointment to 09/30/2017. Will contact the office to provide and update to [place] after her appointment.  Routing to provider for final review. Patient agreeable to disposition. Will close encounter.

## 2017-09-29 NOTE — Telephone Encounter (Signed)
-----   Message from [name], MD sent at 09/24/2017  5:53 PM EDT ----- Please let pt know her hormonal testing was normal.  She is not menopausal or peri-menopausal.  Can you please ask her how the neurology appt went on 10/25 and what testing is going to be done.  Thanks.

## 2017-11-04 ENCOUNTER — Telehealth: Payer: Self-pay | Admitting: Obstetrics & Gynecology

## 2017-11-04 DIAGNOSIS — R1011 Right upper quadrant pain: Secondary | ICD-10-CM

## 2017-11-04 DIAGNOSIS — R140 Abdominal distension (gaseous): Secondary | ICD-10-CM

## 2017-11-04 NOTE — Telephone Encounter (Signed)
Patient last seen: 02/03/17 Routing to triage to contact patient. Patient sent the following message through MyChart:  ----- Message from [place], Generic sent at 11/03/2017 5:38 PM EST -----    Appointment Request From: Dawson Bills    With Provider: [name], MD [facility]]    Preferred Date Range: 11/05/2017 - 11/06/2017    Preferred Times: Any time    Reason for visit: Request an Appointment    Comments:  Abdominal pain and swelling just in the last week.

## 2017-11-04 NOTE — Telephone Encounter (Signed)
Spoke with patient. Patient states that 2 weeks ago she started taking Amoxicillin for a sinus infection. Stopped taking the medication before completing the course, symptoms worsened so she restarted. Began to have abdominal cramping and felt she was getting a yeast infection. Took Diflucan. Has been off all medication for 3 days and is still having generalized cramping and now bloating. Denies any sharp pain. Patient had a hysterectomy on 12/22/2016. Advised this may not be GYN related. Patient would like to see Dr.Miller before seeing PCP, but is aware may need to seek evaluation with PCP as well. Appointment scheduled for 11/05/2017 at 1 pm with Dr.Miller.  Routing to provider for final review.

## 2017-11-05 ENCOUNTER — Other Ambulatory Visit: Payer: Self-pay

## 2017-11-05 ENCOUNTER — Encounter: Payer: Self-pay | Admitting: Obstetrics & Gynecology

## 2017-11-05 ENCOUNTER — Ambulatory Visit (INDEPENDENT_AMBULATORY_CARE_PROVIDER_SITE_OTHER): Payer: Managed Care, Other (non HMO) | Admitting: Obstetrics & Gynecology

## 2017-11-05 VITALS — BP 130/86 | HR 80 | Resp 16 | Ht 66.0 in | Wt 163.0 lb

## 2017-11-05 DIAGNOSIS — R51 Headache: Secondary | ICD-10-CM | POA: Diagnosis not present

## 2017-11-05 DIAGNOSIS — R413 Other amnesia: Secondary | ICD-10-CM | POA: Diagnosis not present

## 2017-11-05 DIAGNOSIS — N898 Other specified noninflammatory disorders of vagina: Secondary | ICD-10-CM

## 2017-11-05 DIAGNOSIS — R140 Abdominal distension (gaseous): Secondary | ICD-10-CM | POA: Diagnosis not present

## 2017-11-05 DIAGNOSIS — R1011 Right upper quadrant pain: Secondary | ICD-10-CM

## 2017-11-05 DIAGNOSIS — G8929 Other chronic pain: Secondary | ICD-10-CM

## 2017-11-05 LAB — POCT URINALYSIS DIPSTICK
Bilirubin, UA: NEGATIVE
Glucose, UA: NEGATIVE
KETONES UA: NEGATIVE
LEUKOCYTES UA: NEGATIVE
Nitrite, UA: NEGATIVE
PROTEIN UA: NEGATIVE
Urobilinogen, UA: 0.2 E.U./dL
pH, UA: 5 (ref 5.0–8.0)

## 2017-11-05 NOTE — Progress Notes (Signed)
Scheduled patient for RUQ ultrasound at [facility] location on 11/09/2017 at 8 am. Patient is agreeable to date and time. Placed in imaging hold.

## 2017-11-05 NOTE — Progress Notes (Signed)
GYNECOLOGY  VISIT  CC:   Abdominal pain  HPI: 41 y.o. [id number] Married Caucasian female here for abdominal pain and bloating.  Feels like she is "pregnant".  Having discomfort with this.  Stool was a little loose and she started on Metamucil.  She's had c diff x 2 so knows was the watery stools are like.  Her bowel movement changes are nothing like that currently.    Reports she was diagnosed with sinus infection and was treated with antibiotic for 14 days.  Because of the c. Diff history, she stopped the antibiotic after 5 days and she was feeling better.  However, symptoms came back very quickly and more severely so ended up completing the full 14 days of antibiotics.  Started having some abdominal pain/cramping as she was finishing antibiotics.  Reports this felt similar to pain she had after her hysterectomy.  Also reports she took a left over diflucan she has at home x 1.  Reports she was not having itching or discharge at that time and the Diflucan really didn't help.    Denies fever.  Denies vaginal bleeding or discharge.  Feels like there may be a little odor.  On separate topic.  Pt reported earlier this year issues with worsening headaches and self-percieved memory changes.  Desired to see someone locally so was referred to neurologist at [facility]  Felt this was useless.  Upon asking, would very much like to see a different provide fo rthis isse  GYNECOLOGIC HISTORY: Patient's last menstrual period was 11/26/2016. Contraception: hysterectomy  Menopausal hormone therapy: none  Patient Active Problem List   Diagnosis Date Noted  . Thyroid disease 01/12/2017  . Asthma 09/09/2016  . Anxiety state 09/09/2016  . Fever blister 09/09/2016  . Enteritis due to Clostridium difficile 09/09/2016    Past Medical History:  Diagnosis Date  . Abnormal uterine bleeding   . Anemia   . Anxiety   . Asthma   . Fibroid   . GERD (gastroesophageal reflux disease)   . Headache   . PONV  (postoperative nausea and vomiting)     Past Surgical History:  Procedure Laterality Date  . CESAREAN SECTION     2003, 2009  . CYSTOSCOPY N/A 12/22/2016   Procedure: CYSTOSCOPY;  Surgeon: [name], MD;  Location: [facility];  Service: Gynecology;  Laterality: N/A;  . LAPAROSCOPIC BILATERAL SALPINGECTOMY Bilateral 12/22/2016   Procedure: LAPAROSCOPIC BILATERAL SALPINGECTOMY;  Surgeon: [name], MD;  Location: [facility];  Service: Gynecology;  Laterality: Bilateral;  . LAPAROSCOPIC HYSTERECTOMY N/A 12/22/2016   Procedure: HYSTERECTOMY TOTAL LAPAROSCOPIC;  Surgeon: [name], MD;  Location: [facility];  Service: Gynecology;  Laterality: N/A;    MEDS:   Current Outpatient Medications on File Prior to Visit  Medication Sig Dispense Refill  . albuterol (PROVENTIL HFA;VENTOLIN HFA) 108 (90 Base) MCG/ACT inhaler Inhale 2 puffs into the lungs daily as needed for wheezing or shortness of breath.     . celecoxib (CELEBREX) 100 MG capsule TAKE 1 CAPSULE (100 MG TOTAL) BY MOUTH 2 (TWO) TIMES DAILY. 60 capsule 0  . cetirizine (ZYRTEC) 10 MG tablet Take 10 mg by mouth daily as needed for allergies (alternates between zyrtec and allegra).     . clonazePAM (KLONOPIN) 0.5 MG tablet Take 0.5 mg by mouth daily as needed for anxiety.    . diphenhydramine-acetaminophen (TYLENOL PM) 25-500 MG TABS tablet Take 1 tablet by mouth at bedtime as needed.    Marland Kitchen esomeprazole ([place]) 20  MG capsule Take 1 capsule by mouth daily.    . hydrocortisone (ANUSOL-HC) 2.5 % rectal cream Place 1 application rectally 4 (four) times daily. Use for hemorrhoid pain. 30 g 1  . ibuprofen (ADVIL,MOTRIN) 800 MG tablet Take 1 tablet (800 mg total) by mouth every 8 (eight) hours as needed. 30 tablet 0  . Multiple Vitamin (MULTI-VITAMINS) TABS Take 1 tablet by mouth daily.    . Omega-3 Fatty Acids (FISH OIL) 500 MG CAPS Take 1,000 mg by mouth daily.     . Probiotic Product (PROBIOTIC PO) Take 1 tablet by mouth daily. Probiotic Restore  Ultra    . tetrahydrozoline-zinc (VISINE-AC) 0.05-0.25 % ophthalmic solution Place 2 drops into both eyes 3 (three) times daily as needed (dry eyes).    . valACYclovir (VALTREX) 1000 MG tablet Take 2 tablets (2,000 mg total) by mouth 2 (two) times daily as needed (outbreaks). 30 tablet 0   No current facility-administered medications on file prior to visit.     ALLERGIES: Patient has no known allergies.  Family History  Problem Relation Age of Onset  . Melanoma Mother   . Hypertension Mother   . Hyperlipidemia Mother   . Breast cancer Mother   . Heart attack Father   . Neuropathy Father   . Hyperlipidemia Father   . Hypertension Father   . Breast cancer Maternal Grandmother     SH:  Married, non smoker  Review of Systems  Gastrointestinal: Positive for abdominal pain.  All other systems reviewed and are negative.   PHYSICAL EXAMINATION:    BP 130/86 (BP Location: Right Arm, Patient Position: Sitting, Cuff Size: Normal)   Pulse 80   Resp 16   Ht 5\' 6"  (1.676 m)   Wt 163 lb (73.9 kg)   LMP 11/26/2016   BMI 26.31 kg/m     General appearance: alert, cooperative and appears stated age CV:  Regular rate and rhythm Lungs:  clear to auscultation, no wheezes, rales or rhonchi, symmetric air entry Abdomen: soft non-tender, moderate disentension; bowel sounds normal; no masses,  no organomegaly, no rebound or guarding  Chaperone was present for exam.  Assessment: Abdominal distension/bloating with mild discomfort Recent antibiotic use H/O c diff colitis x 2 in the past, no current symptoms Chronic headaches with possible memory changes by personal history.  Plan: Affirm pending. Pt advised to start probiotic RUQ ultrasound will be obtained.     ~25 minutes spent with patient >50% of time was in face to face discussion of above.

## 2017-11-05 NOTE — Telephone Encounter (Signed)
Call to [place]. RUQ ultrasound scheduled for 121/10/2017 at 11:20 am to prevent patient from having to travel on 11/09/2017. Call to patient to advise of new appointment, patient states that she would like to keep her appointment with [facility] on 11/09/2017. Return call to [facility], appointment for 11/10/2017 cancelled. Patient to keep appointment as scheduled at [place] on 11/09/2017 at 8 am. Patient is agreeable.  Routing to provider for final review. Patient agreeable to disposition. Will close encounter.

## 2017-11-05 NOTE — Telephone Encounter (Signed)
Patient was seen today and scheduled for an ultrasound 11/09/17. Patient is concerned about the inclement weather on Monday and is wondering if she could be seen sooner or at a facility closer to [facility].

## 2017-11-06 ENCOUNTER — Emergency Department
Admission: EM | Admit: 2017-11-06 | Discharge: 2017-11-06 | Disposition: A | Discharge status: Home / Self Care | Payer: Managed Care, Other (non HMO) | Attending: Emergency Medicine | Admitting: Emergency Medicine

## 2017-11-06 ENCOUNTER — Emergency Department: Payer: Managed Care, Other (non HMO)

## 2017-11-06 DIAGNOSIS — R110 Nausea: Secondary | ICD-10-CM | POA: Insufficient documentation

## 2017-11-06 DIAGNOSIS — R1084 Generalized abdominal pain: Secondary | ICD-10-CM | POA: Insufficient documentation

## 2017-11-06 DIAGNOSIS — J45909 Unspecified asthma, uncomplicated: Secondary | ICD-10-CM | POA: Diagnosis not present

## 2017-11-06 DIAGNOSIS — Z87891 Personal history of nicotine dependence: Secondary | ICD-10-CM | POA: Insufficient documentation

## 2017-11-06 DIAGNOSIS — R140 Abdominal distension (gaseous): Secondary | ICD-10-CM

## 2017-11-06 DIAGNOSIS — K310 Acute dilatation of stomach: Secondary | ICD-10-CM | POA: Diagnosis not present

## 2017-11-06 LAB — COMPREHENSIVE METABOLIC PANEL
ALBUMIN: 4.5 g/dL (ref 3.5–5.0)
ALK PHOS: 54 U/L (ref 38–126)
ALT: 38 U/L (ref 14–54)
AST: 36 U/L (ref 15–41)
Anion gap: 11 (ref 5–15)
BUN: 12 mg/dL (ref 6–20)
CHLORIDE: 102 mmol/L (ref 101–111)
CO2: 24 mmol/L (ref 22–32)
CREATININE: 0.43 mg/dL — AB (ref 0.44–1.00)
Calcium: 9.9 mg/dL (ref 8.9–10.3)
GFR calc non Af Amer: >60 mL/min (ref >60)
GLUCOSE: 84 mg/dL (ref 65–99)
Potassium: 3.5 mmol/L (ref 3.5–5.1)
SODIUM: 137 mmol/L (ref 135–145)
Total Bilirubin: 0.5 mg/dL (ref 0.3–1.2)
Total Protein: 8.2 g/dL — ABNORMAL HIGH (ref 6.5–8.1)

## 2017-11-06 LAB — URINALYSIS, COMPLETE (UACMP) WITH MICROSCOPIC
BILIRUBIN URINE: NEGATIVE
Bacteria, UA: NONE SEEN
Glucose, UA: NEGATIVE mg/dL
KETONES UR: 5 mg/dL — AB
Leukocytes, UA: NEGATIVE
Nitrite: NEGATIVE
Protein, ur: NEGATIVE mg/dL
SPECIFIC GRAVITY, URINE: 1.011 (ref 1.005–1.030)
pH: 5 (ref 5.0–8.0)

## 2017-11-06 LAB — URINE CULTURE

## 2017-11-06 LAB — URINALYSIS, MICROSCOPIC ONLY
Casts: NONE SEEN /lpf
WBC UA: NONE SEEN /HPF (ref 0–?)

## 2017-11-06 LAB — CBC
HCT: 39.7 % (ref 35.0–47.0)
Hemoglobin: 13.8 g/dL (ref 12.0–16.0)
MCH: 33.6 pg (ref 26.0–34.0)
MCHC: 34.7 g/dL (ref 32.0–36.0)
MCV: 96.8 fL (ref 80.0–100.0)
PLATELETS: 202 10*3/uL (ref 150–440)
RBC: 4.1 MIL/uL (ref 3.80–5.20)
RDW: 12.9 % (ref 11.5–14.5)
WBC: 7.1 10*3/uL (ref 3.6–11.0)

## 2017-11-06 MED ORDER — IOPAMIDOL (ISOVUE-300) INJECTION 61%
100.0000 mL | Freq: Once | INTRAVENOUS | Status: AC | PRN
  Administered 2017-11-06: 100 mL via INTRAVENOUS
  Filled 2017-11-06: qty 100

## 2017-11-06 MED ORDER — IBUPROFEN 800 MG PO TABS
ORAL_TABLET | ORAL | Status: AC
  Administered 2017-11-06: 800 mg via ORAL
  Filled 2017-11-06: qty 1

## 2017-11-06 MED ORDER — IBUPROFEN 800 MG PO TABS
800.0000 mg | ORAL_TABLET | Freq: Once | ORAL | Status: AC
  Administered 2017-11-06: 800 mg via ORAL

## 2017-11-06 MED ORDER — ONDANSETRON HCL 4 MG/2ML IJ SOLN
INTRAMUSCULAR | Status: AC
  Administered 2017-11-06: 4 mg via INTRAVENOUS
  Filled 2017-11-06: qty 2

## 2017-11-06 MED ORDER — SIMETHICONE 80 MG PO CHEW: 80.0000 mg | CHEWABLE_TABLET | Freq: Four times a day (QID) | ORAL | 0 refills | Status: DC | PRN

## 2017-11-06 MED ORDER — SODIUM CHLORIDE 0.9 % IV BOLUS (SEPSIS)
1000.0000 mL | Freq: Once | INTRAVENOUS | Status: AC
  Administered 2017-11-06: 1000 mL via INTRAVENOUS

## 2017-11-06 MED ORDER — ONDANSETRON 4 MG PO TBDP: 4.0000 mg | ORAL_TABLET | Freq: Three times a day (TID) | ORAL | 0 refills | Status: DC | PRN

## 2017-11-06 MED ORDER — ONDANSETRON HCL 4 MG/2ML IJ SOLN
4.0000 mg | Freq: Once | INTRAMUSCULAR | Status: AC
  Administered 2017-11-06: 4 mg via INTRAVENOUS

## 2017-11-06 MED ORDER — IBUPROFEN 400 MG PO TABS: 600.0000 mg | ORAL_TABLET | Freq: Once | ORAL | Status: DC

## 2017-11-06 NOTE — ED Provider Notes (Addendum)
[facility] Emergency Department Provider Note  ____________________________________________  Time seen: Approximately 9:32 PM  I have reviewed the triage vital signs and the nursing notes.   HISTORY  Chief Complaint Abdominal Pain    HPI Jamie Lyons is a 41 y.o. female s/p hysterectomy in 01/18, GERD stenting with several weeks of abdominal discomfort, now with distention.  The patient reports that she has had a diffuse, nonfocal abdominal discomfort for several weeks; she felt like this was still part of a long course of healing from her hysterectomy in January.  However, over the past 2 days, she has noted significant abdominal distention.  She has been having normal bowel movements including one today, and passing gas normally.  She has had some mild nausea but no vomiting, no fever.  No dysuria.  No vaginal discharge.  She went to her doctor yesterday, who did a urinalysis and vaginal swabs but the vaginitis swabs are pending and the urine did not have any signs of UTI.   Past Medical History:  Diagnosis Date  . Abnormal uterine bleeding   . Anemia   . Anxiety   . Asthma   . Fibroid   . GERD (gastroesophageal reflux disease)   . Headache   . PONV (postoperative nausea and vomiting)     Patient Active Problem List   Diagnosis Date Noted  . Thyroid disease 01/12/2017  . Asthma 09/09/2016  . Anxiety state 09/09/2016  . Fever blister 09/09/2016  . Enteritis due to Clostridium difficile 09/09/2016    Past Surgical History:  Procedure Laterality Date  . CESAREAN SECTION     2003, 2009  . CYSTOSCOPY N/A 12/22/2016   Procedure: CYSTOSCOPY;  Surgeon: [name], MD;  Location: [facility];  Service: Gynecology;  Laterality: N/A;  . LAPAROSCOPIC BILATERAL SALPINGECTOMY Bilateral 12/22/2016   Procedure: LAPAROSCOPIC BILATERAL SALPINGECTOMY;  Surgeon: [name], MD;  Location: [place] [facility];  Service: Gynecology;  Laterality: Bilateral;  . LAPAROSCOPIC  HYSTERECTOMY N/A 12/22/2016   Procedure: HYSTERECTOMY TOTAL LAPAROSCOPIC;  Surgeon: [name], MD;  Location: [facility] [facility];  Service: Gynecology;  Laterality: N/A;    Current Outpatient Rx  . Order #: [id number] Class: Historical Med  . Order #: [id number] Class: Normal  . Order #: [id number] Class: Historical Med  . Order #: [id number] Class: Historical Med  . Order #: [id number] Class: Historical Med  . Order #: [id number] Class: Historical Med  . Order #: [id number] Class: Normal  . Order #: [id number] Class: Normal  . Order #: [id number] Class: Historical Med  . Order #: [id number] Class: Historical Med  . Order #: [id number] Class: Print  . Order #: [id number] Class: Historical Med  . Order #: [id number] Class: Historical Med  . Order #: [id number] Class: Normal    Allergies Patient has no known allergies.  Family History  Problem Relation Age of Onset  . Melanoma Mother   . Hypertension Mother   . Hyperlipidemia Mother   . Breast cancer Mother   . Heart attack Father   . Neuropathy Father   . Hyperlipidemia Father   . Hypertension Father   . Breast cancer Maternal Grandmother     Social History Social History   Tobacco Use  . Smoking status: Former Smoker    Last attempt to quit: 12/02/2015    Years since quitting: 1.9  . Smokeless tobacco: Never Used  Substance Use Topics  . Alcohol use: Yes    Alcohol/week: 2.4 oz    Types: 2 Glasses of wine, 2 Shots of liquor per week  .  Drug use: No    Review of Systems Constitutional: No fever/chills. Eyes: No visual changes. ENT: No sore throat. No congestion or rhinorrhea. Cardiovascular: Denies chest pain. Denies palpitations. Respiratory: Denies shortness of breath.  No cough. Gastrointestinal: + abdominal pain distention.  Positive nausea, no vomiting.  No diarrhea.  No constipation. Genitourinary: Negative for dysuria.  No urinary frequency.  No change in vaginal discharge. Musculoskeletal: Negative for back pain. Skin: Negative for  rash. Neurological: Negative for headaches. No focal numbness, tingling or weakness.     ____________________________________________   PHYSICAL EXAM:  VITAL SIGNS: ED Triage Vitals  Enc Vitals Group     BP 11/06/17 1940 (!) 146/77     Pulse Rate 11/06/17 1940 76     Resp 11/06/17 1940 14     Temp 11/06/17 1940 99 F (37.2 C)     Temp Source 11/06/17 1940 Oral     SpO2 11/06/17 1940 100 %     Weight 11/06/17 1940 163 lb (73.9 kg)     Height 11/06/17 1940 5\' 6"  (1.676 m)     Head Circumference --      Peak Flow --      Pain Score 11/06/17 1939 4     Pain Loc --      Pain Edu? --      Excl. in [place]? --     Constitutional: Alert and oriented. Well appearing and in no acute distress. Answers questions appropriately. Eyes: Conjunctivae are normal.  EOMI. No scleral icterus. Head: Atraumatic. Nose: No congestion/rhinnorhea. Mouth/Throat: Mucous membranes are moist.  Neck: No stridor.  Supple.   Cardiovascular: Normal rate, regular rhythm. No murmurs, rubs or gallops.  Respiratory: Normal respiratory effort.  No accessory muscle use or retractions. Lungs CTAB.  No wheezes, rales or ronchi. Gastrointestinal: Soft, and moderately distended.  The patient has diffuse mild tenderness to palpation without focality.  No guarding or rebound.  No peritoneal signs. GU: Deferred as the patient had this examination yesterday.   Musculoskeletal: No LE edema.  Neurologic:  A&Ox3.  Speech is clear.  Face and smile are symmetric.  EOMI.  Moves all extremities well. Skin:  Skin is warm, dry and intact. No rash noted. Psychiatric: Mood and affect are normal. Speech and behavior are normal.  Normal judgement.  ____________________________________________   LABS (all labs ordered are listed, but only abnormal results are displayed)  Labs Reviewed  COMPREHENSIVE METABOLIC PANEL - Abnormal; Notable for the following components:      Result Value   Creatinine, Ser 0.43 (*)    Total Protein 8.2  (*)    All other components within normal limits  URINALYSIS, COMPLETE (UACMP) WITH MICROSCOPIC - Abnormal; Notable for the following components:   Color, Urine YELLOW (*)    APPearance CLEAR (*)    Hgb urine dipstick SMALL (*)    Ketones, ur 5 (*)    Squamous Epithelial / LPF 0-5 (*)    All other components within normal limits  CBC   ____________________________________________  EKG  Not indicated  ____________________________________________  RADIOLOGY  No results found.  ____________________________________________   PROCEDURES  Procedure(s) performed: None  Procedures  Critical Care performed: No ____________________________________________   INITIAL IMPRESSION / ASSESSMENT AND PLAN / ED COURSE  Pertinent labs & imaging results that were available during my care of the patient were reviewed by me and considered in my medical decision making (see chart for details).  41 y.o. female with a history of hysterectomy presenting for diffuse  nonfocal abdominal pain for the past several weeks and 2 days of distention.  Overall, the patient is well-appearing.  It would be unlikely to have obstruction, given that she is passing gas and having normal bowel movements, not having any vomiting.  Gas is on the differential.  The patient's laboratory studies are reassuring, as are her vital signs.  I have talked to the patient about x-ray imaging versus CT imaging, and she prefers a CT scan for full evaluation.  We will initiate symptom medic management and reevaluate the patient for final disposition.  I reviewed the patient's medical chart.  ----------------------------------------- 10:17 PM on 11/06/2017 -----------------------------------------  The patient CT scan does not show any acute intra-abdominal pathology.  Her laboratory studies are also reassuring.  At this time, the patient is tolerating by mouth.  Plan discharge home with close PMD follow-up.  Return precautions  were discussed. ____________________________________________  FINAL CLINICAL IMPRESSION(S) / ED DIAGNOSES  Final diagnoses:  Generalized abdominal pain  Abdominal distention  Nausea without vomiting         NEW MEDICATIONS STARTED DURING THIS VISIT:  This SmartLink is deprecated. Use AVSMEDLIST instead to display the medication list for a patient.    Eula Listen, MD 11/06/17 2215    Eula Listen, MD 11/06/17 2217

## 2017-11-06 NOTE — ED Notes (Signed)
First Nurse Note:  Patient reports increased abdominal swelling and discomfort x 1 week.

## 2017-11-06 NOTE — ED Triage Notes (Signed)
Pt states abd distension and pain today. Pt appears distended. Pt states last bowel movement was today, denies constipation. Pt states has had some nausea, no known fever. Pt appears in no acute distress.

## 2017-11-06 NOTE — Discharge Instructions (Signed)
Please take a bland diet until your symptoms have resolved.  Please make an appointment to follow-up with your primary care physician.  Please follow-up the results of your vaginal swabs.  Return to the emergency department if you develop severe pain, inability to keep down fluids, fever, or any other symptoms concerning to you.

## 2017-11-07 LAB — VAGINITIS/VAGINOSIS, DNA PROBE
Candida Species: NEGATIVE
Gardnerella vaginalis: NEGATIVE
TRICHOMONAS VAG: NEGATIVE

## 2017-11-08 ENCOUNTER — Encounter: Payer: Self-pay | Admitting: Obstetrics & Gynecology

## 2017-11-09 ENCOUNTER — Other Ambulatory Visit: Payer: Managed Care, Other (non HMO)

## 2017-11-10 ENCOUNTER — Ambulatory Visit: Payer: Managed Care, Other (non HMO)

## 2017-11-10 ENCOUNTER — Ambulatory Visit: Payer: Managed Care, Other (non HMO) | Admitting: Neurology

## 2017-11-12 ENCOUNTER — Other Ambulatory Visit: Payer: Managed Care, Other (non HMO)

## 2017-11-12 ENCOUNTER — Other Ambulatory Visit: Payer: Self-pay | Admitting: Obstetrics & Gynecology

## 2017-11-12 DIAGNOSIS — R1084 Generalized abdominal pain: Secondary | ICD-10-CM

## 2017-11-12 DIAGNOSIS — R140 Abdominal distension (gaseous): Secondary | ICD-10-CM

## 2017-11-16 ENCOUNTER — Telehealth: Payer: Self-pay | Admitting: Obstetrics & Gynecology

## 2017-11-16 NOTE — Telephone Encounter (Signed)
Returned call to patient. Advised patient a referral has been placed to Dr Vira Agar with [facility] and we are working with their office to schedule. Advised patient once appointment date confirm, we will contact her with the scheduling information. Patient understood and agreeable.   cc: Dr Sabra Heck

## 2017-11-16 NOTE — Telephone Encounter (Signed)
Patient says she was waiting on a call for a referral to a gastroenterologist.

## 2017-11-17 ENCOUNTER — Telehealth: Payer: Self-pay | Admitting: *Deleted

## 2017-11-17 NOTE — Telephone Encounter (Signed)
See telephone encounter dated 11/17/17.

## 2017-11-17 NOTE — Telephone Encounter (Signed)
Spoke with patient in regards to MyChart message as seen below, advised of appointment scheduled for 11/19/17 at 9:30 am with [name], PA. Will keep knee surgery as scheduled. Patient verbalizes understanding and is agreeable to date and time.   Routing to provider for final review. Patient is agreeable to disposition. Will close encounter.  Cc: Lerry Liner

## 2017-11-17 NOTE — Telephone Encounter (Signed)
Spoke with [name] at [facility]. Request to move appointment up to earlier date, scheduled for 11/19/17 at 9:30am with [name], PA.    RE: Visit Follow-Up Question  Message [id number]  From Jamie Lyons To [name], MD Sent 11/17/2017 1:50 PM  I saw on MyChart that I have an appointment with [name], [name] at [facility]. Is this the GI department? I am assuming your nurse scheduled this for Jan 25. I haven't heard from her. I'm not sure if I told you at my last visit but I am having outpatient knee surgery on Jan. 11. It is a Patella Release. Should I put off my surgery until after I see gastro or go ahead and have surgery? Sorry for so many questions. Just want to make sure I am doing the right thing. Thanks in advance!

## 2017-11-20 ENCOUNTER — Other Ambulatory Visit
Admission: RE | Admit: 2017-11-20 | Discharge: 2017-11-20 | Disposition: A | Discharge status: Home / Self Care | Payer: Managed Care, Other (non HMO) | Source: Ambulatory Visit | Attending: Student | Admitting: Student

## 2017-11-20 DIAGNOSIS — R1084 Generalized abdominal pain: Secondary | ICD-10-CM | POA: Diagnosis present

## 2017-11-20 DIAGNOSIS — R6881 Early satiety: Secondary | ICD-10-CM | POA: Insufficient documentation

## 2017-11-20 DIAGNOSIS — R630 Anorexia: Secondary | ICD-10-CM | POA: Insufficient documentation

## 2017-11-20 DIAGNOSIS — K30 Functional dyspepsia: Secondary | ICD-10-CM | POA: Diagnosis present

## 2017-11-26 LAB — H. PYLORI ANTIGEN, STOOL: H. Pylori Stool Ag, Eia: NEGATIVE

## 2017-11-28 LAB — CALPROTECTIN, FECAL: Calprotectin, Fecal: <16 ug/g (ref 0–120)

## 2017-11-30 ENCOUNTER — Telehealth: Payer: Self-pay | Admitting: Obstetrics & Gynecology

## 2017-11-30 ENCOUNTER — Encounter: Payer: Self-pay | Admitting: Obstetrics & Gynecology

## 2017-11-30 NOTE — Telephone Encounter (Signed)
-----   Message from [place], Generic sent at 11/30/2017 9:43 AM EST -----    Good morning! I just wanted to follow up with you about what has been going on. I saw the Gastro Dr. My bloodwork came back normal and am still waiting on my stool results. But this morning I woke up to some watery blood on my underwear. I just checked again and there is a bit more. Should I be concerned? Could this be the cyst they found on my left ovary from CT scan or something else? I am still having the pain and swelling that has been going on for a while now. Could this be from the surgery in Jan or has it been too long for something like that to be wrong? Thanks!

## 2017-11-30 NOTE — Telephone Encounter (Signed)
Spoke with patient, advised as seen below per Dr. Sabra Heck. OV scheduled for 12/02/17 at 8:30am with Dr. Sabra Heck. Patient verbalizes understanding and is agreeable.   Routing to provider for final review. Patient is agreeable to disposition. Will close encounter.

## 2017-11-30 NOTE — Telephone Encounter (Signed)
Spoke with patient in regards to MyChart message as seen below. Reports "watery bloody vaginal d/c" has continued through the day. Patient states she is sure it is coming from vagina. Describes as spotting. Last had intercourse 12/29.   Denies odor, fever/chills, N/V, urinary complaints.   Hx of Hysterectomy.   Reports abdominal pain and bloating has continued, unchanged since last OV on 11/05/17.   Advised patient will review with Dr. Sabra Heck and return call with recommendations. Advised patient Dr. Sabra Heck is in surgery, response may not be immediate, patient is agreeable.   Dr. Sabra Heck -please review and advise?

## 2017-11-30 NOTE — Telephone Encounter (Signed)
Ovarian cyst not likely cause of any of her symptoms as this was only 1.9cm in size.  Needs OV for evaluation of watery bloody discharge.  I don't think the abdominal bloating is due to her surgery as it was almost a year ago and these symptoms are new.

## 2017-12-02 ENCOUNTER — Encounter: Payer: Self-pay | Admitting: Obstetrics & Gynecology

## 2017-12-02 ENCOUNTER — Telehealth: Payer: Self-pay | Admitting: Obstetrics & Gynecology

## 2017-12-02 ENCOUNTER — Ambulatory Visit (INDEPENDENT_AMBULATORY_CARE_PROVIDER_SITE_OTHER): Payer: Managed Care, Other (non HMO) | Admitting: Obstetrics & Gynecology

## 2017-12-02 VITALS — BP 108/70 | HR 80 | Temp 97.6°F | Resp 16 | Ht 66.0 in | Wt 162.0 lb

## 2017-12-02 DIAGNOSIS — R140 Abdominal distension (gaseous): Secondary | ICD-10-CM

## 2017-12-02 DIAGNOSIS — N898 Other specified noninflammatory disorders of vagina: Secondary | ICD-10-CM

## 2017-12-02 NOTE — Progress Notes (Signed)
GYNECOLOGY  VISIT  CC:   Abdominal bloating, h/o c diff colitis x 2,   HPI: 42 y.o. G59P2002 Married Caucasian female here for vaginal discharge and some bleeding that occurred after her last episode of intercourse.  Pt had intercourse 11/28/17 and had a little bit of pink bleeding, watery discharge after.  Here for evaluation of this.  Continues to have constipation issues with bloating.  Saw PA in [place] in GI office.  Had hoped to see MD.  This provider has been seeing her mother and pt desired to see someone closer.  Some blood work was done.  Pt does not have results yet except celiac disease testing was negative.  Is on Senna and that is helping some.  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  Also, father has ulcerative colitis.  Two family members have hx of colon cancer--PGM and PGGM.    Was told she may not need a colonoscopy.  Really wants to get to the bottom of this.  Advised to be seen locally if possible.  Will refer to Dr. Collene Mares.  Is on a probiotic and this doesn't seem to have helped.  Was taking Align after last visit with me and now on one that the GI in [place] gave her.  Cannot recall name.  Mother accompanied pt today.  GYNECOLOGIC HISTORY: Patient's last menstrual period was 11/26/2016. Contraception: hysterectomy  Menopausal hormone therapy: none  Patient Active Problem List   Diagnosis Date Noted  . Thyroid disease 01/12/2017  . Asthma 09/09/2016  . Anxiety state 09/09/2016  . Fever blister 09/09/2016  . Enteritis due to Clostridium difficile 09/09/2016    Past Medical History:  Diagnosis Date  . Abnormal uterine bleeding   . Anemia   . Anxiety   . Asthma   . Fibroid   . GERD (gastroesophageal reflux disease)   . Headache   . PONV (postoperative nausea and vomiting)     Past Surgical History:  Procedure Laterality Date  . CESAREAN SECTION     2003, 2009  . CYSTOSCOPY N/A 12/22/2016   Procedure: CYSTOSCOPY;  Surgeon: [name], MD;   Location: [facility];  Service: Gynecology;  Laterality: N/A;  . LAPAROSCOPIC BILATERAL SALPINGECTOMY Bilateral 12/22/2016   Procedure: LAPAROSCOPIC BILATERAL SALPINGECTOMY;  Surgeon: [name], MD;  Location: [facility];  Service: Gynecology;  Laterality: Bilateral;  . LAPAROSCOPIC HYSTERECTOMY N/A 12/22/2016   Procedure: HYSTERECTOMY TOTAL LAPAROSCOPIC;  Surgeon: [name], MD;  Location: [facility];  Service: Gynecology;  Laterality: N/A;    MEDS:   Current Outpatient Medications on File Prior to Visit  Medication Sig Dispense Refill  . albuterol (PROVENTIL HFA;VENTOLIN HFA) 108 (90 Base) MCG/ACT inhaler Inhale 2 puffs into the lungs daily as needed for wheezing or shortness of breath.     . cetirizine (ZYRTEC) 10 MG tablet Take 10 mg by mouth daily as needed for allergies (alternates between zyrtec and allegra).     . clonazePAM (KLONOPIN) 0.5 MG tablet Take 0.5 mg by mouth daily as needed for anxiety.    . diphenhydramine-acetaminophen (TYLENOL PM) 25-500 MG TABS tablet Take 1 tablet by mouth at bedtime as needed.    Marland Kitchen esomeprazole (NEXIUM) 20 MG capsule Take 1 capsule by mouth daily.    . hydrocortisone (ANUSOL-HC) 2.5 % rectal cream Place 1 application rectally 4 (four) times daily. Use for hemorrhoid pain. 30 g 1  . ibuprofen (ADVIL,MOTRIN) 800 MG tablet Take 1 tablet (800 mg  total) by mouth every 8 (eight) hours as needed. 30 tablet 0  . Multiple Vitamin (MULTI-VITAMINS) TABS Take 1 tablet by mouth daily.    . Omega-3 Fatty Acids (FISH OIL) 500 MG CAPS Take 1,000 mg by mouth daily.     . Probiotic Product (PROBIOTIC PO) Take 1 tablet by mouth daily. Probiotic Restore Ultra    . tetrahydrozoline-zinc (VISINE-AC) 0.05-0.25 % ophthalmic solution Place 2 drops into both eyes 3 (three) times daily as needed (dry eyes).    . valACYclovir (VALTREX) 1000 MG tablet Take 2 tablets (2,000 mg total) by mouth 2 (two) times daily as needed (outbreaks). 30 tablet 0   No current facility-administered  medications on file prior to visit.     ALLERGIES: Patient has no known allergies.  Family History  Problem Relation Age of Onset  . Melanoma Mother   . Hypertension Mother   . Hyperlipidemia Mother   . Breast cancer Mother   . Heart attack Father   . Neuropathy Father   . Hyperlipidemia Father   . Hypertension Father   . Breast cancer Maternal Grandmother     SH:  Married, non smoker  Review of Systems  Gastrointestinal: Positive for abdominal pain.       Bloating   Genitourinary:       Abnormal discharge  Unscheduled bleeding   All other systems reviewed and are negative.   PHYSICAL EXAMINATION:    BP 108/70 (BP Location: Right Arm, Patient Position: Sitting, Cuff Size: Normal)   Pulse 80   Temp 97.6 F (36.4 C) (Oral)   Resp 16   Ht 5\' 6"  (1.676 m)   Wt 162 lb (73.5 kg)   LMP 11/26/2016   BMI 26.15 kg/m      General appearance: alert, cooperative and appears stated age Abdomen: soft, non-tender; bowel sounds normal; no masses,  no organomegaly  Pelvic: External genitalia:  no lesions              Urethra:  normal appearing urethra with no masses, tenderness or lesions              Bartholins and Skenes: normal                 Vagina: normal appearing cuff that is intact, erythema of entire anterior portion of vagina with some yellowish discharge              Cervix: absent              Bimanual Exam:  Uterus:  uterus absent              Adnexa: no mass, fullness, tenderness              Anus:  normal sphincter tone, no lesions, hemorrhoids  Chaperone was present for exam.  Assessment: Abdominal pain and bloating Vaginal discharge Vaginal irritation   Plan: Refer to GI for evaluation and colonoscopy Affirm pending.  If negative, will treat with vaginal estrogen and recheck in 1 month   ~30 minutes spent with patient >50% of time was in face to face discussion of above.

## 2017-12-02 NOTE — Telephone Encounter (Signed)
Patient check on status of referral to gastroenterologist.

## 2017-12-02 NOTE — Telephone Encounter (Signed)
Returned call to patient and notified that Dr.Miller has left message for Dr.Mann to call her to discuss patient. Hopefully she will call and be able to get patient into office soon. Advised patient would call her as soon as Dr.Miller speaks with Dr.Mann.  I did remind patient she needs to make appointment with neurologist we referred her to, Dr.Keith Willis. They have called and left her a message 3 different times. She will go ahead and call their office(she was waiting to resolve GI issue first). Advised it may take a while to get in with Dr.Willis and she should go ahead and make appointment.

## 2017-12-03 ENCOUNTER — Telehealth: Payer: Self-pay | Admitting: *Deleted

## 2017-12-03 LAB — VAGINITIS/VAGINOSIS, DNA PROBE
Candida Species: NEGATIVE
Gardnerella vaginalis: POSITIVE — AB
Trichomonas vaginosis: NEGATIVE

## 2017-12-03 MED ORDER — METRONIDAZOLE 0.75 % VA GEL: VAGINAL | 0 refills | Status: DC

## 2017-12-03 NOTE — Telephone Encounter (Signed)
-----   Message from [name], MD sent at 12/03/2017  3:23 PM EST ----- Please let pt know her vaginitis testing showed BV.  Ok to treat with Metrogel 6.44%, one applicator QHS x 5 nights OR flagyl 500mg  bid x 7 days.  If pt chooses oral medication, please advise no ETOH while on medication.    Can you call Dr. Lorie Apley office for appt for this pt?  CC:  Lowell Bouton

## 2017-12-03 NOTE — Telephone Encounter (Signed)
Appointment scheduled with Dr Collene Mares for Tuesday 12-08-17 at 130.  Call to patient. Advised of appointment with Dr Collene Mares. Phone number and address given. Advised of vaginal culture results as directed by Dr Sabra Heck. Patient prefers Metrogel since has GI issues already. Advised of alcohol precautions while on medication and 3 days following.   Patient scheduled for knee surgery on 1-10--19. Wants Dr Ammie Ferrier opinion on proceeding with surgery versus canceling and proceeding with GI evaluation.   Please advise.

## 2017-12-04 NOTE — Telephone Encounter (Signed)
The following message has been discussed with patient. Closing encounter.

## 2017-12-04 NOTE — Telephone Encounter (Signed)
I would wait until I saw Dr Collene Mares on Tuesday and decide after that appt.

## 2017-12-09 ENCOUNTER — Other Ambulatory Visit: Payer: Self-pay | Admitting: Gastroenterology

## 2017-12-09 DIAGNOSIS — R1011 Right upper quadrant pain: Secondary | ICD-10-CM

## 2017-12-16 ENCOUNTER — Telehealth: Payer: Self-pay | Admitting: Obstetrics & Gynecology

## 2017-12-16 NOTE — Telephone Encounter (Signed)
A referral to Dr Jannifer Franklin, with [facility] was placed by Dr Sabra Heck on 11/05/17 and routed through [place], to Dr Jannifer Franklin on 11/05/17. Referral notes show [facility] left the patient voicemail messages on 11/06/17, 11/19/17, 11/26/17 and on 12/09/17, requesting the patient to call their office for scheduling. Calls were not returned. On 12/15/14 a referral message was forwarded to Dr Sabra Heck to review and advise. Per provider response, referral request to [facility], has been closed, due to no response from patient.  Routing to Dr Sabra Heck for final review

## 2017-12-18 ENCOUNTER — Encounter (HOSPITAL_COMMUNITY)
Admission: RE | Admit: 2017-12-18 | Discharge: 2017-12-18 | Disposition: A | Discharge status: Home / Self Care | Payer: Managed Care, Other (non HMO) | Source: Ambulatory Visit | Attending: Gastroenterology | Admitting: Gastroenterology

## 2017-12-18 ENCOUNTER — Ambulatory Visit (HOSPITAL_COMMUNITY)
Admission: RE | Admit: 2017-12-18 | Discharge: 2017-12-18 | Disposition: A | Discharge status: Home / Self Care | Payer: Managed Care, Other (non HMO) | Source: Ambulatory Visit | Attending: Gastroenterology | Admitting: Gastroenterology

## 2017-12-18 DIAGNOSIS — R1011 Right upper quadrant pain: Secondary | ICD-10-CM | POA: Insufficient documentation

## 2017-12-18 MED ORDER — TECHNETIUM TC 99M MEBROFENIN IV KIT
5.1000 | PACK | Freq: Once | INTRAVENOUS | Status: AC | PRN
  Administered 2017-12-18: 5.1 via INTRAVENOUS

## 2017-12-24 ENCOUNTER — Encounter (HOSPITAL_COMMUNITY): Payer: Managed Care, Other (non HMO)

## 2017-12-24 ENCOUNTER — Other Ambulatory Visit (HOSPITAL_COMMUNITY): Payer: Managed Care, Other (non HMO)

## 2018-01-01 HISTORY — PX: PATELLA RELEASE AND MANIPULATION: SHX2180

## 2018-01-20 ENCOUNTER — Ambulatory Visit: Payer: Managed Care, Other (non HMO) | Admitting: Dietician

## 2018-01-26 ENCOUNTER — Encounter: Payer: Managed Care, Other (non HMO) | Attending: Student | Admitting: Dietician

## 2018-01-26 ENCOUNTER — Encounter: Payer: Self-pay | Admitting: Dietician

## 2018-01-26 VITALS — Ht 66.0 in | Wt 161.4 lb

## 2018-01-26 DIAGNOSIS — Z713 Dietary counseling and surveillance: Secondary | ICD-10-CM | POA: Insufficient documentation

## 2018-01-26 DIAGNOSIS — K589 Irritable bowel syndrome without diarrhea: Secondary | ICD-10-CM | POA: Insufficient documentation

## 2018-01-26 NOTE — Progress Notes (Signed)
Medical Nutrition Therapy: Visit start time: 10:45  end time:11:45 Assessment:  Diagnosis: IBS Past medical history: hx of C-Diff at ages 75 and 75, thyroid disease, eosinophilic esophagitis Psychosocial issues/ stress concerns: Patient rates her stress as moderate and indicates "ok" as to how well she is dealing with her stress. Preferred learning method:  Jamie Lyons . Hands-on Current weight:161.4 lbs Height: 66 in Medications, supplements: see list  Progress and evaluation:  Patient in for initial medical nutrition therapy appointment. She reports onset of stomach pain, spasms and bloating began the first week of December, 2018. She states she has been tested to rule out Celiac and has had both endoscopy and colonoscopy. She was diagnosed with E. Esophagitis and possible IBS. She has tried to follow a Fodmap diet in the past 1.5 weeks but stated it was somewhat overwhelming and she would like some more guidance regarding the diet. She states that so far, there has been no change in her symptoms which usually begin after breakfast and continue throughout the day. She does continue to include wheat products/gluten.She has not been able to identify specific foods that make symptoms worse. She reports that she typically has a bowel movement daily with the help of Senna Plus. She states her weight has remained relatively stable since 10/2017. Physical activity: no structured activity  Dietary Intake:  Usual eating pattern includes 3 meals and 1-2 snacks per day. Dining out frequency: 2 meals per week.  Breakfast: Drinks an energy drink [employer]); usually eats an egg/toast or frittata/toast Lunch: salad with chicken/vegetables; olive oil/vineagrette dressing, water Supper: chicken, pasta salad, vegetables for example Beverages:energy drink,   Nutrition Care Education:  IBS: Reviewed and gave instruction on Fodmap diet. Explained that must avoid wheat products to eliminate gluten. Gave and  reviewed gluten free diet.  Discussed how her energy drink not only contains caffeine but sucralose as well which is not digested well by some people. Stressed need for calcium and discussed lactose free options. Discussed the anti-inflammatory properties of fruits, vegetables but encourage to eat only low fodmap choices and for now suggested cooked vegetables and soft or cooked fruits. Nutritional Diagnosis:  [place]-1.4 Altered GI function As related to E. esophagitis, IBS.  As evidenced by MD diagnosis and symptoms.  Intervention:  Eliminate energy drink. Limit coffee to 1 cup. Try eliminating gluten for 2 weeks.Refer to list. Stay on other Fodmap restrictions for 2 weeks. Try FairLife milk that is lactose free.  Include cooked vegetables rather than raw for the 2 weeks as well as soft or cooked fruits. Continue to eliminate red meats.  Education Materials given:  Marland Kitchen Your Gut Guide  Low FODMAP diet  Low FODMAP diet grocery list  Gluten free diet   Learner/ who was taught:  . Patient   Level of understanding: . Partial understanding; needs review/ practice Demonstrated degree of understanding via:   Teach back Learning barriers: . None  Willingness to learn/ readiness for change: . Eager, change in progress  Monitoring and Evaluation:  Dietary intake, exercise, , and body weight,       follow up: 02/16/18 at 10:30

## 2018-01-26 NOTE — Patient Instructions (Addendum)
Eliminate energy drink. Limit coffee to 1 cup. Try eliminating gluten for 2 weeks.Refer to list. Stay on other Fodmap restrictions for 2 weeks. Try FairLife milk that is lactose free.  Include cooked vegetables rather than raw for the 2 weeks as well as soft or cooked fruits. Continue to eliminate red meats.

## 2018-02-11 ENCOUNTER — Other Ambulatory Visit: Payer: Self-pay | Admitting: Obstetrics & Gynecology

## 2018-02-11 NOTE — Telephone Encounter (Signed)
Medication refill request: Valtrex 1gm #30 Last AEX:  Last OV 12-02-17 Next AEX: none scheduled Last MMG (if hormonal medication request): n/a Refill authorized: Please advise

## 2018-02-16 ENCOUNTER — Ambulatory Visit: Payer: Managed Care, Other (non HMO) | Admitting: Dietician

## 2018-02-23 ENCOUNTER — Encounter: Payer: Self-pay | Admitting: Dietician

## 2018-02-23 ENCOUNTER — Encounter: Payer: Managed Care, Other (non HMO) | Attending: Student | Admitting: Dietician

## 2018-02-23 DIAGNOSIS — Z713 Dietary counseling and surveillance: Secondary | ICD-10-CM | POA: Diagnosis not present

## 2018-02-23 DIAGNOSIS — K589 Irritable bowel syndrome without diarrhea: Secondary | ICD-10-CM | POA: Insufficient documentation

## 2018-02-23 NOTE — Progress Notes (Signed)
Medical Nutrition Therapy Follow-up visit:  Time with patient: 11-12:00pm Visit #: 2 ASSESSMENT:  Diagnosis:IBS  Current weight:Didn't weigh today; patient has on soft brace due to knee surgery     Medications: See list Medical History: hx of C-Diff at ages 36 and 46, thyroid disease, eosinophillic esophagitis Progress and evaluation: Patient in for medical nutrition follow-up. She states that IBS symptoms have improved but she continues to have bloating and "stomach upset" after some meals. She is following the fodmap diet with only a few exceptions. Although not a specific pattern to her symptoms, they occur more frequently after raw vegetables in a salad. She reports she tolerates salads at home better than salads in restaurants. Symptoms also occur more frequently after higher fat meals. She recorded one incidence of hives after a [place] meal of steak/chicken fajitas, black beans, quacamole, rice, tortilla, sour cream, queso and chips.   Physical activity:none due to knee surgery  NUTRITION CARE EDUCATION: IBS: Reviewed food records and her recorded  IBS symptoms with patient. Discussed again how trial and error is part of identifying trigger foods.  Discussed foods to reintroduce but only one at a time to better judge tolerance. Also, encouraged again to include more Fairlife lactose free milk to increase calcium intake.   NUTRITIONAL DIAGNOSIS:  [place]-1.4 Altered GI function As related to E.esophagitis and IBS.  As evidenced by diet history.. INTERVENTION:  Try salad at restaurant with no dressing at least once to see if dressing is the trigger. Otherwise, eat cooked vegetables. Include at least 1 cup of Fairlife milk  to increase calcium intake. Cook all vegetables. Introduce canned peaches or applesauce and check for any tolerance symptoms. Be aware of fat content. Consider limiting fat at a meal to 10-12 gms and 5-6 grams per snack. Ok to test out some lean red  meat.  EDUCATION MATERIALS GIVEN:  . Goals/ instructions LEARNER/ who was taught:  . Patient   LEVEL OF UNDERSTANDING: . Verbalizes/ demonstrates competency  . Demonstrated degree of understanding via teach back.  LEARNING BARRIERS: . None  WILLINGNESS TO LEARN/READINESS FOR CHANGE: . Eager, change in progress  MONITORING AND EVALUATION:   Diet, IBS symptoms, weight Follow-up: 03/23/18 at 11:00am

## 2018-02-23 NOTE — Patient Instructions (Addendum)
Try salad at restaurant with no dressing at least once to see if dressing is the trigger. Otherwise, eat cooked vegetables. Include at least 1 cup of Fairlife milk  to increase calcium intake. Cook all vegetables. Introduce canned peaches or applesauce and check for any tolerance symptoms. Be aware of fat content. Consider limiting fat at a meal to 10-12 gms and 5-6 grams per snack. Ok to test out some lean red meat.

## 2018-03-23 ENCOUNTER — Ambulatory Visit: Payer: Managed Care, Other (non HMO) | Admitting: Dietician

## 2018-03-23 ENCOUNTER — Encounter: Payer: Self-pay | Admitting: Dietician

## 2018-03-30 ENCOUNTER — Encounter: Payer: Self-pay | Admitting: Obstetrics & Gynecology

## 2018-03-30 ENCOUNTER — Telehealth: Payer: Self-pay | Admitting: Obstetrics & Gynecology

## 2018-03-30 NOTE — Telephone Encounter (Signed)
Patient sent the following correspondence through [employer]. Routing to triage to assist patient with request.  ----- Message from [place], Generic sent at 03/30/2018 9:28 AM EDT -----    Good morning! I am pretty sure I have bacterial vaginosis once again. Since you have treated me for this last time I was there, would it be possible to call in the Rx for me to my pharmacy without having to come to [facility] for an examination? I can if absolutely necessary, but I am recovering from knee surgery so it makes it more difficult for me right now. Thanks!

## 2018-03-30 NOTE — Telephone Encounter (Signed)
Spoke with patient. Reports pelvic floor "spasms", lower abdominal cramping, small amount of darker vaginal d/c with odor, urinary frequency and not emptying bladder completely. Symptoms started 4 days ago. Hx of hysterectomy. Denies lower back pain, fever/chills, N/V.   Patient requesting RX for BV, states she is experiencing same symptoms as in 12/2017. Advised OV recommended for further evaluation to ensure appropriate tx. OV scheduled for 5/3 at 1pm with Dr. Sabra Heck. Patient declined earlier appointments offered. Advised to return call for earlier OV if symptoms worsen or new symptoms develop. Urgent care if after hours.   Routing to provider for final review. Patient is agreeable to disposition. Will close encounter.

## 2018-04-02 ENCOUNTER — Encounter: Payer: Self-pay | Admitting: Obstetrics & Gynecology

## 2018-04-02 ENCOUNTER — Ambulatory Visit (INDEPENDENT_AMBULATORY_CARE_PROVIDER_SITE_OTHER): Payer: Managed Care, Other (non HMO) | Admitting: Obstetrics & Gynecology

## 2018-04-02 VITALS — BP 106/70 | HR 80 | Resp 16 | Ht 66.0 in | Wt 158.0 lb

## 2018-04-02 DIAGNOSIS — N898 Other specified noninflammatory disorders of vagina: Secondary | ICD-10-CM

## 2018-04-02 DIAGNOSIS — R300 Dysuria: Secondary | ICD-10-CM | POA: Diagnosis not present

## 2018-04-02 LAB — POCT URINALYSIS DIPSTICK
BILIRUBIN UA: NEGATIVE
GLUCOSE UA: NEGATIVE
KETONES UA: NEGATIVE
Leukocytes, UA: NEGATIVE
Nitrite, UA: NEGATIVE
Protein, UA: NEGATIVE
RBC UA: NEGATIVE
Urobilinogen, UA: 0.2 E.U./dL
pH, UA: 5 (ref 5.0–8.0)

## 2018-04-02 MED ORDER — SULFAMETHOXAZOLE-TRIMETHOPRIM 800-160 MG PO TABS: 1.0000 | ORAL_TABLET | Freq: Two times a day (BID) | ORAL | 0 refills | Status: AC

## 2018-04-02 MED ORDER — METRONIDAZOLE 0.75 % VA GEL: 1.0000 | Freq: Every day | VAGINAL | 0 refills | Status: AC

## 2018-04-02 NOTE — Progress Notes (Signed)
GYNECOLOGY  VISIT  CC:   Vaginal discharge  HPI: 42 y.o. G60P2002 Married Caucasian female here for dysuria and vaginal discharge.  Having some increased urinary frequency.  Having pelvic floor spasm.  Having mild dysuria.  Also, has noted some vaginal discharge.    Did see Dr. Collene Mares.  Had a HIDA scan.  Keeping a food diary.  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  Eating a more strict diet.  This has really helped.    Denies fever or back pain.  GYNECOLOGIC HISTORY: Patient's last menstrual period was 11/26/2016. Contraception: hysterectomy  Menopausal hormone therapy: none  Patient Active Problem List   Diagnosis Date Noted  . Thyroid disease 01/12/2017  . Asthma 09/09/2016  . Anxiety state 09/09/2016  . Fever blister 09/09/2016  . Enteritis due to Clostridium difficile 09/09/2016    Past Medical History:  Diagnosis Date  . Abnormal uterine bleeding   . Anemia   . Anxiety   . Asthma   . Fibroid   . GERD (gastroesophageal reflux disease)   . Headache   . PONV (postoperative nausea and vomiting)     Past Surgical History:  Procedure Laterality Date  . CESAREAN SECTION     2003, 2009  . CYSTOSCOPY N/A 12/22/2016   Procedure: CYSTOSCOPY;  Surgeon: [name], MD;  Location: [facility];  Service: Gynecology;  Laterality: N/A;  . LAPAROSCOPIC BILATERAL SALPINGECTOMY Bilateral 12/22/2016   Procedure: LAPAROSCOPIC BILATERAL SALPINGECTOMY;  Surgeon: [name], MD;  Location: [facility];  Service: Gynecology;  Laterality: Bilateral;  . LAPAROSCOPIC HYSTERECTOMY N/A 12/22/2016   Procedure: HYSTERECTOMY TOTAL LAPAROSCOPIC;  Surgeon: [name], MD;  Location: [facility];  Service: Gynecology;  Laterality: N/A;    MEDS:   Current Outpatient Medications on File Prior to Visit  Medication Sig Dispense Refill  . albuterol (PROVENTIL HFA;VENTOLIN HFA) 108 (90 Base) MCG/ACT inhaler Inhale 2 puffs into the lungs daily as needed for wheezing or  shortness of breath.     Marland Kitchen aspirin EC 325 MG tablet Take 1 tablet by mouth daily.    . cetirizine (ZYRTEC) 10 MG tablet Take 10 mg by mouth daily as needed for allergies (alternates between zyrtec and allegra).     . clonazePAM (KLONOPIN) 0.5 MG tablet Take 0.5 mg by mouth daily as needed for anxiety.    . diphenhydramine-acetaminophen (TYLENOL PM) 25-500 MG TABS tablet Take 1 tablet by mouth at bedtime as needed.    Marland Kitchen esomeprazole (NEXIUM) 20 MG capsule Take 1 capsule by mouth daily.    Marland Kitchen FLOVENT DISKUS 250 MCG/BLIST AEPB daily as needed.    . hydrocortisone (ANUSOL-HC) 2.5 % rectal cream Place 1 application rectally 4 (four) times daily. Use for hemorrhoid pain. 30 g 1  . ibuprofen (ADVIL,MOTRIN) 200 MG tablet Take by mouth daily as needed.    . Multiple Vitamin (MULTI-VITAMINS) TABS Take 1 tablet by mouth daily.    . Omega-3 Fatty Acids (FISH OIL) 500 MG CAPS Take 1,000 mg by mouth daily.     . Probiotic Product (PROBIOTIC PO) Take 1 tablet by mouth daily. Probiotic Restore Ultra    . tetrahydrozoline-zinc (VISINE-AC) 0.05-0.25 % ophthalmic solution Place 2 drops into both eyes 3 (three) times daily as needed (dry eyes).    . valACYclovir (VALTREX) 1000 MG tablet TAKE 2 TABLETS (2,000 MG TOTAL) BY MOUTH 2 (TWO) TIMES DAILY AS NEEDED (OUTBREAKS). 30 tablet 0   No current facility-administered medications on  file prior to visit.     ALLERGIES: Patient has no known allergies.  Family History  Problem Relation Age of Onset  . Melanoma Mother   . Hypertension Mother   . Hyperlipidemia Mother   . Breast cancer Mother   . Heart attack Father   . Neuropathy Father   . Hyperlipidemia Father   . Hypertension Father   . Ulcerative colitis Father   . Breast cancer Maternal Grandmother   . Colon cancer Paternal Grandmother        70's with diagnosis  . Colon cancer Other        40's with diagnosis    SH:  Married, non smoker  Review of Systems  Genitourinary: Positive for dysuria,  frequency and urgency.  All other systems reviewed and are negative.   PHYSICAL EXAMINATION:    BP 106/70 (BP Location: Right Arm, Patient Position: Sitting, Cuff Size: Normal)   Pulse 80   Resp 16   Ht 5\' 6"  (1.676 m)   Wt 158 lb (71.7 kg)   LMP 11/26/2016   BMI 25.50 kg/m     General appearance: alert, cooperative and appears stated age Abdomen: soft, non-tender; bowel sounds normal; no masses,  no organomegaly  Pelvic: External genitalia:  no lesions              Urethra:  normal appearing urethra with no masses, tenderness or lesions              Bartholins and Skenes: normal                 Vagina: normal appearing vagina with whitish discharge that did have some odor, no lesions              Cervix: absent              Bimanual Exam:  Uterus:  uterus absent              Adnexa: no mass, fullness, tenderness  Chaperone was present for exam.  Assessment: Vaginal discharge H/O BV in 12/18 Dysuria  Plan: Affirm pending Metrogel one applicator full nightly x 5 nights.  Having GI testing next week for bacterial overgrowth so do not feel should use oral flagyl. Urine culture pending

## 2018-04-03 LAB — URINE CULTURE: Organism ID, Bacteria: NO GROWTH

## 2018-04-03 LAB — VAGINITIS/VAGINOSIS, DNA PROBE
CANDIDA SPECIES: NEGATIVE
Gardnerella vaginalis: NEGATIVE
TRICHOMONAS VAG: NEGATIVE

## 2018-04-05 ENCOUNTER — Telehealth: Payer: Self-pay | Admitting: Obstetrics & Gynecology

## 2018-04-05 NOTE — Telephone Encounter (Signed)
Patient requesting results from urine culture and affirm. Advised Dr.Miller hasn't reviewed but all looked negative. She has not started the Metrogel yet but states still having vaginal odor and dark discharge. Wants to know if Dr.wants her to go ahead and use the metrogel? Any further recommendations? Will discuss with Dr.Miller and call her back. Routed to provider.

## 2018-04-05 NOTE — Telephone Encounter (Signed)
Patient is requesting results.

## 2018-04-07 NOTE — Telephone Encounter (Signed)
Spoke with patient. Advised of message as seen below from Dr.Miller. Patient verbalizes understanding. Encounter closed. 

## 2018-04-07 NOTE — Telephone Encounter (Signed)
Left message to call Kaitlyn at [phone]. 

## 2018-04-07 NOTE — Telephone Encounter (Signed)
Please let her know testing was negative but there was discharge with some odor present when she was here so I do want her to use the prescription and let me know how her symptoms are after she's finished.  Thanks.

## 2018-08-16 IMAGING — NM NM HEPATO W/GB/PHARM/[PERSON_NAME]
3 series · 13 of 13 positions shown · non-contrast
Comparison: None.

CLINICAL DATA: Right upper quadrant pain

EXAM:
NUCLEAR MEDICINE HEPATOBILIARY IMAGING WITH GALLBLADDER EF
VIEWS:
Anterior, right lateral right upper quadrant
RADIOPHARMACEUTICALS:  5.0 mCi 1c-JJm  Choletec IV

[he hepatobiliary · 3.10mm/px · 6 of 60 frames shown (1 of 3)]
[frame 6/60]
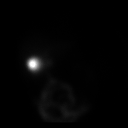
[frame 16/60]
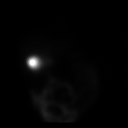
[frame 26/60]
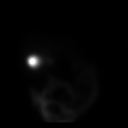
[frame 36/60]
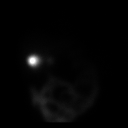
[frame 46/60]
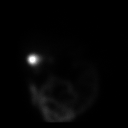
[frame 56/60]
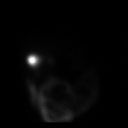

[he hepatobiliary · 2.26mm/px · 1 of 1 slices shown (2 of 3)]
[im 1/1]
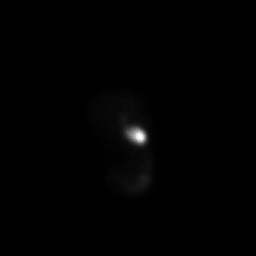

[he hepatobiliary · 3.10mm/px · 6 of 60 frames shown (3 of 3)]
[frame 6/60]
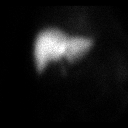
[frame 16/60]
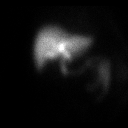
[frame 26/60]
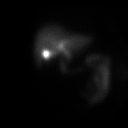
[frame 36/60]
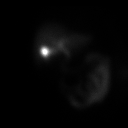
[frame 46/60]
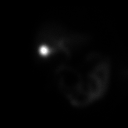
[frame 56/60]
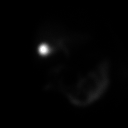

[13 of 13 positions shown; findings below may reference images not displayed]

FINDINGS: Liver uptake of radiotracer is normal. There is prompt visualization
of gallbladder and small bowel, indicating patency of the cystic and
common bile ducts. The patient consumed 8 ounces of Ensure Complete
orally with calculation of the computer generated ejection fraction
of radiotracer from the gallbladder. The patient did not experience
clinical symptoms with the oral Ensure Complete consumption. The
computer generated ejection fraction of radiotracer from the
gallbladder is within normal limits at 40%, normal greater than 33%
using the oral agent.
IMPRESSION: Study within normal limits, although the ejection fraction
percentage from the gallbladder is toward the lower limits of
normal.

## 2021-07-22 ENCOUNTER — Other Ambulatory Visit: Payer: Self-pay | Admitting: Gastroenterology

## 2021-07-22 DIAGNOSIS — R109 Unspecified abdominal pain: Secondary | ICD-10-CM

## 2021-07-22 DIAGNOSIS — R634 Abnormal weight loss: Secondary | ICD-10-CM

## 2021-08-06 ENCOUNTER — Other Ambulatory Visit: Payer: Managed Care, Other (non HMO)

## 2021-08-22 ENCOUNTER — Ambulatory Visit
Admission: RE | Admit: 2021-08-22 | Discharge: 2021-08-22 | Disposition: A | Discharge status: Home / Self Care | Payer: Managed Care, Other (non HMO) | Source: Ambulatory Visit | Attending: Gastroenterology | Admitting: Gastroenterology

## 2021-08-22 DIAGNOSIS — R634 Abnormal weight loss: Secondary | ICD-10-CM

## 2021-08-22 DIAGNOSIS — R109 Unspecified abdominal pain: Secondary | ICD-10-CM

## 2021-08-22 MED ORDER — IOPAMIDOL (ISOVUE-300) INJECTION 61%
100.0000 mL | Freq: Once | INTRAVENOUS | Status: AC | PRN
  Administered 2021-08-22: 100 mL via INTRAVENOUS

## 2022-04-25 ENCOUNTER — Other Ambulatory Visit: Payer: Self-pay | Admitting: Physician Assistant

## 2022-04-25 DIAGNOSIS — Z1231 Encounter for screening mammogram for malignant neoplasm of breast: Secondary | ICD-10-CM

## 2022-05-27 ENCOUNTER — Ambulatory Visit
Admission: RE | Admit: 2022-05-27 | Discharge: 2022-05-27 | Disposition: A | Discharge status: Home / Self Care | Payer: 59 | Source: Ambulatory Visit | Attending: Physician Assistant | Admitting: Physician Assistant

## 2022-05-27 DIAGNOSIS — Z1231 Encounter for screening mammogram for malignant neoplasm of breast: Secondary | ICD-10-CM | POA: Insufficient documentation

## 2022-06-11 ENCOUNTER — Inpatient Hospital Stay
Admission: RE | Admit: 2022-06-11 | Discharge: 2022-06-11 | Disposition: A | Discharge status: Home / Self Care | Payer: Self-pay | Source: Ambulatory Visit | Attending: *Deleted | Admitting: *Deleted

## 2022-06-11 ENCOUNTER — Other Ambulatory Visit: Payer: Self-pay | Admitting: *Deleted

## 2022-06-11 DIAGNOSIS — Z1231 Encounter for screening mammogram for malignant neoplasm of breast: Secondary | ICD-10-CM

## 2023-05-19 ENCOUNTER — Other Ambulatory Visit: Payer: Self-pay

## 2023-05-19 ENCOUNTER — Other Ambulatory Visit: Payer: Self-pay | Admitting: Family

## 2023-05-19 DIAGNOSIS — Z1231 Encounter for screening mammogram for malignant neoplasm of breast: Secondary | ICD-10-CM

## 2023-06-22 ENCOUNTER — Ambulatory Visit
Admission: RE | Admit: 2023-06-22 | Discharge: 2023-06-22 | Disposition: A | Discharge status: Home / Self Care | Payer: Managed Care, Other (non HMO) | Source: Ambulatory Visit | Attending: Family | Admitting: Family

## 2023-06-22 DIAGNOSIS — Z1231 Encounter for screening mammogram for malignant neoplasm of breast: Secondary | ICD-10-CM | POA: Diagnosis present

## 2023-06-23 ENCOUNTER — Ambulatory Visit: Payer: Self-pay | Admitting: Physician Assistant

## 2023-06-23 NOTE — Progress Notes (Deleted)
New patient visit  Patient: Jamie Lyons   DOB: 1976-03-31   47 y.o. Female  MRN: [id number] Visit Date: 06/23/2023  Today's healthcare provider: Debera Lat, PA-C   No chief complaint on file.  Subjective    Jamie Lyons is a 47 y.o. female who presents today as a new patient to establish care.  HPI  *** Discussed the use of AI scribe software for clinical note transcription with the patient, who gave verbal consent to proceed.  History of Present Illness            Past Medical History:  Diagnosis Date   Abnormal uterine bleeding    Anemia    Anxiety    Asthma    Fibroid    GERD (gastroesophageal reflux disease)    Headache    PONV (postoperative nausea and vomiting)    Past Surgical History:  Procedure Laterality Date   CESAREAN SECTION     2003, 2009   CYSTOSCOPY N/A 12/22/2016   Procedure: CYSTOSCOPY;  Surgeon: Jerene Bears, MD;  Location: [facility];  Service: Gynecology;  Laterality: N/A;   LAPAROSCOPIC BILATERAL SALPINGECTOMY Bilateral 12/22/2016   Procedure: LAPAROSCOPIC BILATERAL SALPINGECTOMY;  Surgeon: Jerene Bears, MD;  Location: [facility];  Service: Gynecology;  Laterality: Bilateral;   LAPAROSCOPIC HYSTERECTOMY N/A 12/22/2016   Procedure: HYSTERECTOMY TOTAL LAPAROSCOPIC;  Surgeon: Jerene Bears, MD;  Location: [facility];  Service: Gynecology;  Laterality: N/A;   PATELLA RELEASE AND MANIPULATION Left 01/2018   Family Status  Relation Name Status   Mother  Alive   Father  Alive   MGM  Alive   PGM  (Not Specified)   Other MGGM Deceased  No partnership data on file   Family History  Problem Relation Age of Onset   Melanoma Mother    Hypertension Mother    Hyperlipidemia Mother    Breast cancer Mother    Heart attack Father    Neuropathy Father    Hyperlipidemia Father    Hypertension Father    Ulcerative colitis Father    Breast cancer Maternal Grandmother    Colon cancer Paternal Grandmother        78's with diagnosis   Colon cancer Other         70's with diagnosis   Social History   Socioeconomic History   Marital status: Married    Spouse name: Not on file   Number of children: Not on file   Years of education: Not on file   Highest education level: Not on file  Occupational History   Not on file  Tobacco Use   Smoking status: Former    Current packs/day: 0.00    Types: Cigarettes    Quit date: 12/02/2015    Years since quitting: 7.5   Smokeless tobacco: Never  Substance and Sexual Activity   Alcohol use: Yes    Alcohol/week: 4.0 standard drinks of alcohol    Types: 2 Glasses of wine, 2 Shots of liquor per week   Drug use: No   Sexual activity: Yes    Birth control/protection: None  Other Topics Concern   Not on file  Social History Narrative   Not on file   Social Determinants of Health   Financial Resource Strain: Low Risk  (05/14/2023)   Received from [facility], [facility]   Overall Financial Resource Strain (CARDIA)    Difficulty of Paying Living Expenses: Not hard at all  Food Insecurity: No Food Insecurity (  05/14/2023)   Received from [facility], [facility]   Hunger Vital Sign    Worried About Running Out of Food in the Last Year: Never true    Ran Out of Food in the Last Year: Never true  Transportation Needs: No Transportation Needs (05/14/2023)   Received from [facility], [facility]   [facility] - Transportation    Lack of Transportation (Medical): No    Lack of Transportation (Non-Medical): No  Physical Activity: Not on file  Stress: Not on file  Social Connections: Not on file   Outpatient Medications Prior to Visit  Medication Sig   albuterol (PROVENTIL HFA;VENTOLIN HFA) 108 (90 Base) MCG/ACT inhaler Inhale 2 puffs into the lungs daily as needed for wheezing or shortness of breath.    cetirizine (ZYRTEC) 10 MG tablet Take 10 mg by mouth daily as needed for allergies (alternates between zyrtec and allegra).    clonazePAM (KLONOPIN) 0.5 MG tablet Take 0.5 mg by mouth  daily as needed for anxiety.   diphenhydramine-acetaminophen (TYLENOL PM) 25-500 MG TABS tablet Take 1 tablet by mouth at bedtime as needed.   esomeprazole (NEXIUM) 20 MG capsule Take 1 capsule by mouth daily.   FLOVENT DISKUS 250 MCG/BLIST AEPB daily as needed.   hydrocortisone (ANUSOL-HC) 2.5 % rectal cream Place 1 application rectally 4 (four) times daily. Use for hemorrhoid pain.   ibuprofen (ADVIL,MOTRIN) 200 MG tablet Take by mouth daily as needed.   metroNIDAZOLE (METROGEL) 0.75 % vaginal gel Place 1 Applicatorful vaginally at bedtime.   Multiple Vitamin (MULTI-VITAMINS) TABS Take 1 tablet by mouth daily.   Omega-3 Fatty Acids (FISH OIL) 500 MG CAPS Take 1,000 mg by mouth daily.    Probiotic Product (PROBIOTIC PO) Take 1 tablet by mouth daily. Probiotic Restore Ultra   sulfamethoxazole-trimethoprim (BACTRIM DS,SEPTRA DS) 800-160 MG tablet Take 1 tablet by mouth 2 (two) times daily.   tetrahydrozoline-zinc (VISINE-AC) 0.05-0.25 % ophthalmic solution Place 2 drops into both eyes 3 (three) times daily as needed (dry eyes).   valACYclovir (VALTREX) 1000 MG tablet TAKE 2 TABLETS (2,000 MG TOTAL) BY MOUTH 2 (TWO) TIMES DAILY AS NEEDED (OUTBREAKS).   No facility-administered medications prior to visit.   No Known Allergies  Immunization History  Administered Date(s) Administered   Moderna Sars-Covid-2 Vaccination 02/15/2020, 03/14/2020    Health Maintenance  Topic Date Due   HIV Screening  Never done   Hepatitis C Screening  Never done   DTaP/Tdap/Td (1 - Tdap) Never done   PAP SMEAR-Modifier  12/17/2018   COVID-19 Vaccine (4 - 2023-24 season) 08/01/2022   INFLUENZA VACCINE  07/02/2023   HPV VACCINES  Aged Out    Patient Care Team: Swiner, Wendy Poet, MD as PCP - General (Family Medicine)  Review of Systems Except see HPI   {Insert previous labs (optional):23779}  {See past labs  Heme  Chem  Endocrine  Serology  Results Review (optional):1}   Objective    LMP  11/26/2016  {Insert last BP/Wt (optional):23777}  {See vitals history (optional):1}  Physical Exam  Depression Screen    01/26/2018   10:44 AM  PHQ 2/9 Scores  PHQ - 2 Score 0   No results found for any visits on 06/23/23.  Assessment & Plan     *** Assessment and Plan              Encounter to establish care Welcomed to our clinic Reviewed past medical hx, social hx, family hx and surgical hx  Pt advised to send all vaccination records or screening   No follow-ups on file.     [facility]

## 2024-05-16 ENCOUNTER — Other Ambulatory Visit: Payer: Self-pay | Admitting: Family

## 2024-05-16 DIAGNOSIS — Z13820 Encounter for screening for osteoporosis: Secondary | ICD-10-CM

## 2024-05-16 DIAGNOSIS — Z1231 Encounter for screening mammogram for malignant neoplasm of breast: Secondary | ICD-10-CM

## 2024-07-11 ENCOUNTER — Other Ambulatory Visit: Payer: Self-pay | Admitting: Medical Genetics

## 2024-07-15 ENCOUNTER — Other Ambulatory Visit: Payer: Self-pay

## 2024-07-19 ENCOUNTER — Other Ambulatory Visit
Admission: RE | Admit: 2024-07-19 | Discharge: 2024-07-19 | Disposition: A | Discharge status: Home / Self Care | Payer: Self-pay | Source: Ambulatory Visit | Attending: Medical Genetics | Admitting: Medical Genetics

## 2024-07-20 ENCOUNTER — Other Ambulatory Visit: Payer: Self-pay | Admitting: Medical Genetics

## 2024-07-20 DIAGNOSIS — Z006 Encounter for examination for normal comparison and control in clinical research program: Secondary | ICD-10-CM

## 2024-07-21 NOTE — Addendum Note (Signed)
 Addended by: REMUS NOEMI PARAS on: 07/21/2024 01:11 PM   Modules accepted: Orders

## 2024-07-25 ENCOUNTER — Other Ambulatory Visit: Payer: Self-pay

## 2024-07-29 ENCOUNTER — Other Ambulatory Visit: Payer: Self-pay | Admitting: Medical Genetics

## 2024-07-29 DIAGNOSIS — Z006 Encounter for examination for normal comparison and control in clinical research program: Secondary | ICD-10-CM

## 2024-08-02 ENCOUNTER — Other Ambulatory Visit: Payer: Self-pay

## 2024-08-12 LAB — GENECONNECT MOLECULAR SCREEN: Genetic Analysis Overall Interpretation: NEGATIVE

## 2024-08-22 ENCOUNTER — Ambulatory Visit
Admission: RE | Admit: 2024-08-22 | Discharge: 2024-08-22 | Disposition: A | Discharge status: Home / Self Care | Payer: Self-pay | Source: Ambulatory Visit | Attending: Family | Admitting: Family

## 2024-08-22 ENCOUNTER — Ambulatory Visit
Admission: RE | Admit: 2024-08-22 | Discharge: 2024-08-22 | Disposition: A | Discharge status: Home / Self Care | Payer: Self-pay | Source: Ambulatory Visit | Attending: Family

## 2024-08-22 DIAGNOSIS — Z13820 Encounter for screening for osteoporosis: Secondary | ICD-10-CM

## 2024-08-22 DIAGNOSIS — Z1231 Encounter for screening mammogram for malignant neoplasm of breast: Secondary | ICD-10-CM | POA: Diagnosis present
